# Patient Record
Sex: Male | Born: 2000
Health system: Southern US, Community
[De-identification: ages and names within clinical notes are randomized; demographics above are authoritative.]

## PROBLEM LIST (undated history)

## (undated) DIAGNOSIS — B356 Tinea cruris: Secondary | ICD-10-CM

## (undated) DIAGNOSIS — J069 Acute upper respiratory infection, unspecified: Secondary | ICD-10-CM

## (undated) DIAGNOSIS — S62102A Fracture of unspecified carpal bone, left wrist, initial encounter for closed fracture: Secondary | ICD-10-CM

## (undated) DIAGNOSIS — J302 Other seasonal allergic rhinitis: Secondary | ICD-10-CM

## (undated) HISTORY — DX: Tinea cruris: B35.6

## (undated) HISTORY — DX: Acute upper respiratory infection, unspecified: J06.9

## (undated) HISTORY — DX: Other seasonal allergic rhinitis: J30.2

## (undated) HISTORY — PX: NO PAST SURGERIES: SHX2092

## (undated) HISTORY — DX: Fracture of unspecified carpal bone, left wrist, initial encounter for closed fracture: S62.102A

---
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
## 2020-01-21 MED FILL — ADDERALL XR 30 MG CAP SA: 30 | 30 days supply | Qty: 30 | Fill #0

## 2020-02-20 MED FILL — ADDERALL XR 30 MG CAP SA: 30 | 30 days supply | Qty: 30 | Fill #0

## 2020-04-10 ENCOUNTER — Encounter: Payer: Self-pay | Admitting: Internal Medicine

## 2020-04-10 DIAGNOSIS — Z7184 Encounter for health counseling related to travel: Secondary | ICD-10-CM

## 2020-04-16 ENCOUNTER — Other Ambulatory Visit: Payer: Self-pay

## 2020-04-16 DIAGNOSIS — Z7184 Encounter for health counseling related to travel: Secondary | ICD-10-CM

## 2020-04-21 NOTE — Addendum Note (Signed)
Addended byConrad Hanover D on: 04/21/2020 03:55 PM   Modules accepted: Orders

## 2020-04-24 ENCOUNTER — Other Ambulatory Visit: Payer: Self-pay

## 2020-04-24 DIAGNOSIS — Z7184 Encounter for health counseling related to travel: Secondary | ICD-10-CM

## 2020-04-29 ENCOUNTER — Other Ambulatory Visit: Payer: Self-pay | Admitting: Internal Medicine

## 2020-05-01 LAB — SARS-COV-2 SEMI-QUANTITATIVE TOTAL ANTIBODY, SPIKE: SARS COV2 AB, Total Spike Semi QN: >2500 U/mL — ABNORMAL HIGH (ref <0.8)

## 2020-05-01 LAB — HOUSE ACCOUNT TRACKING

## 2020-05-13 ENCOUNTER — Encounter: Payer: Self-pay | Admitting: Family Medicine

## 2020-05-13 ENCOUNTER — Other Ambulatory Visit: Payer: Self-pay | Admitting: Family Medicine

## 2020-05-13 ENCOUNTER — Other Ambulatory Visit: Payer: Self-pay

## 2020-05-13 ENCOUNTER — Ambulatory Visit (INDEPENDENT_AMBULATORY_CARE_PROVIDER_SITE_OTHER): Payer: No Typology Code available for payment source | Admitting: Family Medicine

## 2020-05-13 VITALS — BP 126/78 | Temp 98.2°F | Ht 71.0 in | Wt 159.0 lb

## 2020-05-13 DIAGNOSIS — F988 Other specified behavioral and emotional disorders with onset usually occurring in childhood and adolescence: Secondary | ICD-10-CM | POA: Diagnosis not present

## 2020-05-13 MED ORDER — AMPHETAMINE-DEXTROAMPHET ER 30 MG PO CP24: 30.0000 mg | ORAL_CAPSULE | Freq: Every day | ORAL | 0 refills | Status: DC

## 2020-05-13 MED ORDER — AMPHETAMINE-DEXTROAMPHET ER 30 MG PO CP24: 30.0000 mg | ORAL_CAPSULE | ORAL | 0 refills | Status: DC

## 2020-05-13 NOTE — Progress Notes (Signed)
   Subjective:    Patient ID: Matthew Ibarra, male    DOB: [birth year], 19 y.o.   MRN: [id number]  HPIpt arrives today to establish care and to get refills on adderall xr 30mg  in the mornings.   Pt states no concerns or problems today.  Patient was diagnosed couple years ago with ADD.  He is always had this ever since his early years in elementary school but never had a bad enough to affect his grades but then in college its been very difficult so therefore he started on the medicine have tried a couple different medicines in a couple different dosage things but the current medication is doing the best for him it does allow him to focus does not keep him awake at night Patient does eat and drink on a regular basis stays physically active  Review of Systems  Constitutional: Negative for activity change, appetite change and fatigue.  HENT: Negative for congestion and rhinorrhea.   Respiratory: Negative for cough and shortness of breath.   Cardiovascular: Negative for chest pain and leg swelling.  Gastrointestinal: Negative for abdominal pain, nausea and vomiting.  Neurological: Negative for dizziness and headaches.  Psychiatric/Behavioral: Negative for agitation and behavioral problems.       Objective:   Physical Exam Constitutional:      General: He is not in acute distress.    Appearance: He is well-developed.  HENT:     Head: Normocephalic.  Cardiovascular:     Rate and Rhythm: Normal rate and regular rhythm.     Heart sounds: Normal heart sounds. No murmur heard.   Pulmonary:     Effort: Pulmonary effort is normal.     Breath sounds: Normal breath sounds.  Skin:    General: Skin is warm and dry.  Neurological:     Mental Status: He is alert.  Psychiatric:        Behavior: Behavior normal.           Assessment & Plan:  Adult ADD Doing well on medication 3 scripts were put in today Patient can message me when he needs his next prescription and we could do a phone  visit He is a at [facility] Certainly if any setbacks or problems to notify [facility] sooner.  Adult wellness on a regular basis recommended Patient states that his dad who is a physician did some blood work he will have that faxed to [place]

## 2020-05-23 ENCOUNTER — Encounter: Payer: Self-pay | Admitting: Family Medicine

## 2020-05-23 DIAGNOSIS — Z1321 Encounter for screening for nutritional disorder: Secondary | ICD-10-CM

## 2020-05-23 DIAGNOSIS — Z1329 Encounter for screening for other suspected endocrine disorder: Secondary | ICD-10-CM

## 2020-05-23 DIAGNOSIS — Z0000 Encounter for general adult medical examination without abnormal findings: Secondary | ICD-10-CM

## 2020-05-23 DIAGNOSIS — Z13220 Encounter for screening for lipoid disorders: Secondary | ICD-10-CM

## 2020-05-23 DIAGNOSIS — Z79899 Other long term (current) drug therapy: Secondary | ICD-10-CM

## 2020-05-26 NOTE — Addendum Note (Signed)
Addended by: Marlowe Shores on: 05/26/2020 04:25 PM   Modules accepted: Orders

## 2020-05-26 NOTE — Telephone Encounter (Signed)
Nurses Patient had lab work back in 2019 and at that time comprehensive lab work was completed I recommend CMP, CBC, lipid, vitamin D, TSH, free T4, T3

## 2020-05-28 ENCOUNTER — Encounter: Payer: Self-pay | Admitting: Family Medicine

## 2020-05-28 ENCOUNTER — Other Ambulatory Visit: Payer: Self-pay | Admitting: Family Medicine

## 2020-05-28 MED ORDER — AMPHETAMINE-DEXTROAMPHET ER 30 MG PO CP24: 30.0000 mg | ORAL_CAPSULE | ORAL | 0 refills | Status: DC

## 2020-05-28 MED ORDER — AMPHETAMINE-DEXTROAMPHET ER 30 MG PO CP24: 30.0000 mg | ORAL_CAPSULE | Freq: Every day | ORAL | 0 refills | Status: DC

## 2020-05-29 MED FILL — ADDERALL XR 30 MG CAP SA: 30 | 30 days supply | Qty: 30 | Fill #0

## 2020-06-17 ENCOUNTER — Encounter: Payer: No Typology Code available for payment source | Admitting: Internal Medicine

## 2020-06-26 MED FILL — ADDERALL XR 30 MG CAP SA: 30 | 30 days supply | Qty: 30 | Fill #0

## 2020-07-25 MED FILL — ADDERALL XR 30 MG CAP SA: 30 | 30 days supply | Qty: 30 | Fill #0

## 2020-08-27 ENCOUNTER — Other Ambulatory Visit: Payer: Self-pay | Admitting: Family Medicine

## 2020-08-28 ENCOUNTER — Other Ambulatory Visit: Payer: Self-pay | Admitting: Family Medicine

## 2020-08-29 ENCOUNTER — Other Ambulatory Visit: Payer: Self-pay | Admitting: Family Medicine

## 2020-08-29 ENCOUNTER — Other Ambulatory Visit: Payer: Self-pay | Admitting: *Deleted

## 2020-08-29 MED ORDER — AMPHETAMINE-DEXTROAMPHET ER 30 MG PO CP24: 30.0000 mg | ORAL_CAPSULE | ORAL | 0 refills | Status: DC

## 2020-08-29 MED FILL — ADDERALL XR 30 MG CAP SA: 30 | 15 days supply | Qty: 15 | Fill #0

## 2020-08-29 NOTE — Telephone Encounter (Signed)
Pt states he sent a mychart message yesterday but never heard back. Requesting a refill on adderall to [facility]. Last ADD check up 05/13/20.

## 2020-08-29 NOTE — Telephone Encounter (Signed)
Pt notified a 2 week supply was sent in and that he needed an appt and transferred up to schedule an appt

## 2020-08-29 NOTE — Addendum Note (Signed)
Addended by: Margaretha Sheffield on: 08/29/2020 04:48 PM   Modules accepted: Orders

## 2020-08-31 ENCOUNTER — Other Ambulatory Visit: Payer: Self-pay | Admitting: Family Medicine

## 2020-09-05 ENCOUNTER — Other Ambulatory Visit: Payer: Self-pay

## 2020-09-05 ENCOUNTER — Other Ambulatory Visit: Payer: Self-pay | Admitting: Family Medicine

## 2020-09-05 ENCOUNTER — Telehealth (INDEPENDENT_AMBULATORY_CARE_PROVIDER_SITE_OTHER): Payer: No Typology Code available for payment source | Admitting: Family Medicine

## 2020-09-05 DIAGNOSIS — F988 Other specified behavioral and emotional disorders with onset usually occurring in childhood and adolescence: Secondary | ICD-10-CM | POA: Diagnosis not present

## 2020-09-05 MED ORDER — AMPHETAMINE-DEXTROAMPHET ER 30 MG PO CP24
30.0000 mg | ORAL_CAPSULE | ORAL | 0 refills | Status: DC
Start: 2020-09-05 — End: 2021-01-16

## 2020-09-05 MED ORDER — AMPHETAMINE-DEXTROAMPHET ER 30 MG PO CP24: 30.0000 mg | ORAL_CAPSULE | Freq: Every day | ORAL | 0 refills | Status: DC

## 2020-09-05 MED ORDER — AMPHETAMINE-DEXTROAMPHET ER 30 MG PO CP24
30.0000 mg | ORAL_CAPSULE | Freq: Every day | ORAL | 0 refills | Status: DC
Start: 2020-09-05 — End: 2021-01-16

## 2020-09-05 NOTE — Progress Notes (Signed)
   Subjective:    Patient ID: Matthew Ibarra, male    DOB: Apr 15, 2001, 19 y.o.   MRN: [id number]  HPIADD check up. Taking adderall xr 30 mg one daily. Working well and no problems.  Young man is putting in a very strong effort at [employer].  He is studying multiple different courses that are very difficult.  Medication does help him stay intensive.  He denies any setbacks.  Able to sleep well.  Eating well.  Energy level good.  Moods are doing well denies being depressed.  His toughest classes physics easiest his physiology.  Very nice young man Virtual Visit via Telephone Note  I connected with Matthew Ibarra on 09/05/20 at  1:10 PM EDT by telephone and verified that I am speaking with the correct person using two identifiers.  Location: Patient: home Provider: office   I discussed the limitations, risks, security and privacy concerns of performing an evaluation and management service by telephone and the availability of in person appointments. I also discussed with the patient that there may be a patient responsible charge related to this service. The patient expressed understanding and agreed to proceed.   History of Present Illness:    Observations/Objective:   Assessment and Plan:   Follow Up Instructions:    I discussed the assessment and treatment plan with the patient. The patient was provided an opportunity to ask questions and all were answered. The patient agreed with the plan and demonstrated an understanding of the instructions.   The patient was advised to call back or seek an in-person evaluation if the symptoms worsen or if the condition fails to improve as anticipated.  I provided 25 including time on phone chart review sending in prescriptions and documentation minutes of non-face-to-face time during this encounter.        Review of Systems  Constitutional: Negative for activity change, appetite change and fatigue.  HENT: Negative for congestion and rhinorrhea.    Respiratory: Negative for cough and shortness of breath.   Cardiovascular: Negative for chest pain and leg swelling.  Gastrointestinal: Negative for abdominal pain, nausea and vomiting.  Neurological: Negative for dizziness and headaches.  Psychiatric/Behavioral: Negative for agitation and behavioral problems.       Objective:   Physical Exam  Today's visit was via telephone Physical exam was not possible for this visit       Assessment & Plan:  The patient was seen today as part of the visit regarding ADD.  Patient is stable on current regimen.  Appropriate prescriptions prescribed.  Medications were reviewed with the patient as well as compliance. Side effects were checked for. Discussion regarding effectiveness was held. Prescriptions were electronically sent in.  Patient reminded to follow-up in approximately 3 months.   Plans to [facility] law with drug registry was checked and verified while present with the patient.

## 2020-09-15 MED FILL — ADDERALL XR 30 MG CAP SA: 30 | 30 days supply | Qty: 30 | Fill #0

## 2020-10-16 MED FILL — ADDERALL XR 30 MG CAP SA: 30 | 30 days supply | Qty: 30 | Fill #0

## 2020-12-01 MED FILL — ADDERALL XR 30 MG CAP SA: 30 | 30 days supply | Qty: 30 | Fill #0

## 2020-12-17 ENCOUNTER — Encounter: Payer: Self-pay | Admitting: Family Medicine

## 2021-01-01 ENCOUNTER — Other Ambulatory Visit: Payer: Self-pay | Admitting: Family Medicine

## 2021-01-02 ENCOUNTER — Other Ambulatory Visit: Payer: Self-pay | Admitting: Family Medicine

## 2021-01-02 ENCOUNTER — Other Ambulatory Visit: Payer: Self-pay

## 2021-01-02 ENCOUNTER — Telehealth: Payer: Self-pay | Admitting: *Deleted

## 2021-01-02 ENCOUNTER — Telehealth (INDEPENDENT_AMBULATORY_CARE_PROVIDER_SITE_OTHER): Payer: No Typology Code available for payment source | Admitting: Family Medicine

## 2021-01-02 DIAGNOSIS — F988 Other specified behavioral and emotional disorders with onset usually occurring in childhood and adolescence: Secondary | ICD-10-CM | POA: Diagnosis not present

## 2021-01-02 MED ORDER — LISDEXAMFETAMINE DIMESYLATE 30 MG PO CAPS: 30.0000 mg | ORAL_CAPSULE | Freq: Every day | ORAL | 0 refills | Status: DC

## 2021-01-02 MED FILL — VYVANSE 30 MG CAPSULE: 30 | 30 days supply | Qty: 30 | Fill #0

## 2021-01-02 NOTE — Telephone Encounter (Signed)
Mr. razi, hickle are scheduled for a virtual visit with your provider today.    Just as we do with appointments in the office, we must obtain your consent to participate.  Your consent will be active for this visit and any virtual visit you may have with one of our providers in the next 365 days.    If you have a MyChart account, I can also send a copy of this consent to you electronically.  All virtual visits are billed to your insurance company just like a traditional visit in the office.  As this is a virtual visit, video technology does not allow for your provider to perform a traditional examination.  This may limit your provider's ability to fully assess your condition.  If your provider identifies any concerns that need to be evaluated in person or the need to arrange testing such as labs, EKG, etc, we will make arrangements to do so.    Although advances in technology are sophisticated, we cannot ensure that it will always work on either your end or our end.  If the connection with a video visit is poor, we may have to switch to a telephone visit.  With either a video or telephone visit, we are not always able to ensure that we have a secure connection.   I need to obtain your verbal consent now.   Are you willing to proceed with your visit today?   Matthew Ibarra has provided verbal consent on 01/02/2021 for a virtual visit (video or telephone).

## 2021-01-02 NOTE — Progress Notes (Signed)
   Subjective:    Patient ID: Matthew Ibarra, male    DOB: 2001/03/06, 20 y.o.   MRN: [id number]  HPI  Patient calls to discuss ADHD. Patient states he would like to discuss changing his ADHD medication due to side effects. Very nice patient Works very hard at school  Patient very busy at school.  Classes seem to be going well.  Does try to exercise on a regular basis.  Also tries to eat healthy for the most part.  In addition to this goes to bed around 10:00 at night gets up around 5 AM and gets approximately 6 hours of sleep per night moods are doing good Dietary going well He does state that the medication seems to make him feel on edge and at times makes his hands tremor he is interested in trying a different medicine.  His mother takes Vyvanse. Virtual Visit via Telephone Note  I connected with Matthew Ibarra on 01/02/21 at  8:40 AM EST by telephone and verified that I am speaking with the correct person using two identifiers.  Location: Patient: home Provider: office   I discussed the limitations, risks, security and privacy concerns of performing an evaluation and management service by telephone and the availability of in person appointments. I also discussed with the patient that there may be a patient responsible charge related to this service. The patient expressed understanding and agreed to proceed.   History of Present Illness:    Observations/Objective:   Assessment and Plan:   Follow Up Instructions:    I discussed the assessment and treatment plan with the patient. The patient was provided an opportunity to ask questions and all were answered. The patient agreed with the plan and demonstrated an understanding of the instructions.   The patient was advised to call back or seek an in-person evaluation if the symptoms worsen or if the condition fails to improve as anticipated.  I provided 20 chart preparation phone time documentation minutes of non-face-to-face  time during this encounter.       Review of Systems On edge at times with medication, some tremor    Objective:   Physical Exam   Today's visit was via telephone Physical exam was not possible for this visit      Assessment & Plan:  Adult ADD Has had this since early years of school.  Has been on Adderall.  This medication seemed to be doing well up until recently.  We will switch over to Vyvanse 30 mg Patient will send [place] a MyChart message in 2 weeks we can adjust upward or downward depending on how he is doing with this  We did discuss the concept that the frontal lobe matures as a person gets older and for men typically matures by age 24 so therefore later he may not necessarily need to be on as much medicine and in some cases not on any medicine.  Currently the medicine does benefit him but he would prefer to be on a dose that he does not have as much side effects and he would like to try Vyvanse which I am in agreement with.  He will do a in person visit after school is finished in the spring.  Goes to [employer].

## 2021-01-05 ENCOUNTER — Telehealth: Payer: Self-pay | Admitting: *Deleted

## 2021-01-05 NOTE — Telephone Encounter (Signed)
good

## 2021-01-05 NOTE — Telephone Encounter (Signed)
[facility] sent fax stating that Vyvanse 30 mg is not covered by insurance plan: I Clinically appropriate you may change the prescription. Please advise

## 2021-01-05 NOTE — Telephone Encounter (Signed)
[facility] pharmacy states to disregard the message -they were able to get the medication to a $30 charge and it has been picked up with no problems

## 2021-01-05 NOTE — Telephone Encounter (Signed)
Nurses-please try to find out what is covered thank you

## 2021-01-16 ENCOUNTER — Encounter: Payer: Self-pay | Admitting: Family Medicine

## 2021-01-16 ENCOUNTER — Other Ambulatory Visit: Payer: Self-pay | Admitting: Family Medicine

## 2021-01-19 ENCOUNTER — Other Ambulatory Visit: Payer: Self-pay | Admitting: Family Medicine

## 2021-01-19 MED ORDER — LISDEXAMFETAMINE DIMESYLATE 40 MG PO CAPS: ORAL_CAPSULE | ORAL | 0 refills | Status: DC

## 2021-01-19 NOTE — Telephone Encounter (Signed)
Nurses Please pend Vyvanse 40 mg, #30, 1 every morning  He will be able to try this dosage for the next month and if this is doing well we can send an additional prescriptions

## 2021-01-19 NOTE — Addendum Note (Signed)
Addended by: Marlowe Shores on: 01/19/2021 09:01 AM   Modules accepted: Orders

## 2021-02-10 ENCOUNTER — Encounter: Payer: Self-pay | Admitting: Family Medicine

## 2021-02-10 NOTE — Telephone Encounter (Signed)
Last physical 2018. Seen this month for med check up. Do you want pt to schedule wellness to fill out form?

## 2021-02-11 ENCOUNTER — Other Ambulatory Visit: Payer: Self-pay | Admitting: Family Medicine

## 2021-02-11 DIAGNOSIS — Z111 Encounter for screening for respiratory tuberculosis: Secondary | ICD-10-CM

## 2021-02-11 NOTE — Telephone Encounter (Signed)
Nurses If the form is straightforward and is essentially just needing a TB test he can do the TB test through [place].   It would be wise for the patient to know that the TB test is a two-step meaning doing a TB test and reading it a couple days later. All he can do gold TB test which is a blood test which does not require the follow-up If his forearm does have a physical portion within it that requires documentation of a physical exam I would recommend a physical otherwise I am willing to fill out the form Finally just for good health it would be wise for him to set up a wellness checkup somewhere in the summer Please work with Matthew Ibarra regarding all this thanks

## 2021-03-03 LAB — QUANTIFERON-TB GOLD PLUS
QuantiFERON Mitogen Value: >10 IU/mL
QuantiFERON Nil Value: 0.08 IU/mL
QuantiFERON TB1 Ag Value: 0.12 IU/mL
QuantiFERON TB2 Ag Value: 0.14 IU/mL
QuantiFERON-TB Gold Plus: NEGATIVE

## 2021-03-04 ENCOUNTER — Encounter: Payer: Self-pay | Admitting: Family Medicine

## 2021-03-04 ENCOUNTER — Other Ambulatory Visit (HOSPITAL_COMMUNITY): Payer: Self-pay

## 2021-03-04 ENCOUNTER — Other Ambulatory Visit: Payer: Self-pay | Admitting: Family Medicine

## 2021-03-04 ENCOUNTER — Telehealth: Payer: Self-pay | Admitting: Family Medicine

## 2021-03-04 MED ORDER — LISDEXAMFETAMINE DIMESYLATE 40 MG PO CAPS
40.0000 mg | ORAL_CAPSULE | ORAL | 0 refills | Status: DC
  Filled 2021-03-04 – 2021-04-10 (×2): qty 30, 30d supply, fill #0

## 2021-03-04 MED ORDER — LISDEXAMFETAMINE DIMESYLATE 40 MG PO CAPS
40.0000 mg | ORAL_CAPSULE | Freq: Every morning | ORAL | 0 refills | Status: DC
  Filled 2021-03-04: qty 30, 30d supply, fill #0

## 2021-03-04 NOTE — Telephone Encounter (Signed)
I will send in 1 additional refill on his ADD medicine

## 2021-03-05 ENCOUNTER — Other Ambulatory Visit (HOSPITAL_COMMUNITY): Payer: Self-pay

## 2021-03-08 NOTE — Telephone Encounter (Signed)
Nurses I saw this MyChart message late Friday after office hours.  The form does appear to be completed.  Please forward it to the patient.

## 2021-04-10 ENCOUNTER — Other Ambulatory Visit (HOSPITAL_COMMUNITY): Payer: Self-pay

## 2021-06-26 ENCOUNTER — Other Ambulatory Visit: Payer: Self-pay | Admitting: Family Medicine

## 2021-06-26 ENCOUNTER — Other Ambulatory Visit (HOSPITAL_COMMUNITY): Payer: Self-pay

## 2021-06-27 MED ORDER — LISDEXAMFETAMINE DIMESYLATE 40 MG PO CAPS
40.0000 mg | ORAL_CAPSULE | ORAL | 0 refills | Status: DC
  Filled 2021-06-27 – 2021-07-01 (×2): qty 30, 30d supply, fill #0

## 2021-06-29 ENCOUNTER — Other Ambulatory Visit (HOSPITAL_COMMUNITY): Payer: Self-pay

## 2021-07-01 ENCOUNTER — Other Ambulatory Visit (HOSPITAL_COMMUNITY): Payer: Self-pay

## 2021-07-02 ENCOUNTER — Other Ambulatory Visit (HOSPITAL_COMMUNITY): Payer: Self-pay

## 2021-07-09 ENCOUNTER — Other Ambulatory Visit (HOSPITAL_COMMUNITY): Payer: Self-pay

## 2021-08-17 ENCOUNTER — Encounter: Payer: Self-pay | Admitting: Family Medicine

## 2021-08-17 ENCOUNTER — Other Ambulatory Visit: Payer: Self-pay | Admitting: Family Medicine

## 2021-08-17 ENCOUNTER — Other Ambulatory Visit (HOSPITAL_COMMUNITY): Payer: Self-pay

## 2021-08-17 MED ORDER — LISDEXAMFETAMINE DIMESYLATE 40 MG PO CAPS
40.0000 mg | ORAL_CAPSULE | Freq: Every morning | ORAL | 0 refills | Status: DC
  Filled 2021-08-17 – 2021-09-23 (×2): qty 30, 30d supply, fill #0

## 2021-08-17 MED ORDER — LISDEXAMFETAMINE DIMESYLATE 40 MG PO CAPS
40.0000 mg | ORAL_CAPSULE | ORAL | 0 refills | Status: DC
  Filled 2021-08-17 (×2): qty 30, 30d supply, fill #0

## 2021-08-17 NOTE — Telephone Encounter (Signed)
Nurses I sent into refills on his medication I would like for him to do a follow-up visit for ADD this can be this fall either virtually or in person for either later in October or if that does not work for his schedule by November thanks-Dr.Rosamond Andress

## 2021-08-21 ENCOUNTER — Telehealth: Payer: Self-pay | Admitting: Family Medicine

## 2021-08-21 DIAGNOSIS — Z8489 Family history of other specified conditions: Secondary | ICD-10-CM

## 2021-08-21 NOTE — Telephone Encounter (Signed)
Nurses Cardiology consultation Reason family history of sudden death Family history LVH and cardiomyopathy (His father was [name] Freitas who suffered sudden death and had died due to dilated cardiomyopathy and sudden death with left ventricular hypertrophy)  Family prefers [facility] cardiology because patient goes to school at [employer]

## 2021-08-21 NOTE — Telephone Encounter (Signed)
Referral placed in Epic.

## 2021-08-27 ENCOUNTER — Telehealth: Payer: Self-pay | Admitting: Family Medicine

## 2021-08-27 NOTE — Telephone Encounter (Signed)
Nurses Patient's mother reached out to me (Dr. Karilyn Cota) stating that cardiology did not receive the information regarding the referral Please send Toni Amend a message Family connected with cardiology Cardiology stated that they did not receive a referral Please have Toni Amend resend information to cardiology regarding this referral Thanks-Dr. Lorin Picket

## 2021-09-02 NOTE — Telephone Encounter (Signed)
Noted thank you, I did send a staff message to his mother who is a pediatrician

## 2021-09-23 ENCOUNTER — Telehealth: Payer: Self-pay | Admitting: Family Medicine

## 2021-09-23 ENCOUNTER — Other Ambulatory Visit (HOSPITAL_COMMUNITY): Payer: Self-pay

## 2021-09-23 NOTE — Telephone Encounter (Signed)
Patient schedule follow up office visit with Dr Lorin Picket 10/21/21. Patient states he does not need a refill of his medication at this time- he will have enough till his appointment.

## 2021-09-23 NOTE — Telephone Encounter (Signed)
It would be fine to give him an appointment anywhere in the next 3 to 4 weeks I am willing to work with his schedule if it is better to do a virtual visit toward the end of the afternoon we could do this around 4:40 PM Let me know if something else needs to be done  If he is out of medication I can easily send him in a refill.  Does he need a refill currently?

## 2021-09-23 NOTE — Telephone Encounter (Signed)
Patient is in [place]. States he usually does phone or virtual visit for ADHD meds. Needs appointment to get meds. Please advise.  Pt#: [phone]

## 2021-10-21 ENCOUNTER — Other Ambulatory Visit (HOSPITAL_COMMUNITY): Payer: Self-pay

## 2021-10-21 ENCOUNTER — Other Ambulatory Visit: Payer: Self-pay

## 2021-10-21 ENCOUNTER — Ambulatory Visit (INDEPENDENT_AMBULATORY_CARE_PROVIDER_SITE_OTHER): Payer: No Typology Code available for payment source | Admitting: Family Medicine

## 2021-10-21 DIAGNOSIS — F988 Other specified behavioral and emotional disorders with onset usually occurring in childhood and adolescence: Secondary | ICD-10-CM | POA: Diagnosis not present

## 2021-10-21 MED ORDER — LISDEXAMFETAMINE DIMESYLATE 40 MG PO CAPS
40.0000 mg | ORAL_CAPSULE | ORAL | 0 refills | Status: DC
  Filled 2021-10-21 – 2022-01-04 (×2): qty 30, 30d supply, fill #0

## 2021-10-21 MED ORDER — LISDEXAMFETAMINE DIMESYLATE 40 MG PO CAPS
40.0000 mg | ORAL_CAPSULE | Freq: Every morning | ORAL | 0 refills | Status: DC
  Filled 2021-10-21 – 2022-02-18 (×2): qty 30, 30d supply, fill #0

## 2021-10-21 MED ORDER — LISDEXAMFETAMINE DIMESYLATE 40 MG PO CAPS
40.0000 mg | ORAL_CAPSULE | ORAL | 0 refills | Status: DC
  Filled 2021-10-21 – 2021-10-22 (×3): qty 30, 30d supply, fill #0

## 2021-10-21 NOTE — Progress Notes (Signed)
  I connected with  PEYSON POSTEMA on 10/21/21 by a phone enabled telemedicine application and verified that I am speaking with the correct person using two identifiers.   I discussed the limitations of evaluation and management by telemedicine. The patient expressed understanding and agreed to proceed.  Patient location: home  Provider location: in office  I provided 15 minutes of non face - to - face time during this encounter.  Subjective:    Patient ID: Matthew Ibarra, male    DOB: 07/26/2001, 20 y.o.   MRN: [id number]  HPI  Patient was seen today for ADD checkup.  This patient does have ADD.  Patient takes medications for this.  If this does help control overall symptoms.  Please see below. -weight, vital signs reviewed.  The following items were covered. -Compliance with medication : daily  -Problems with completing homework, paying attention/taking good notes in school: none  -grades: ok  - Eating patterns : no  -sleeping: no  -Additional issues or questions: none He is going through stress related to the loss of his dad earlier this year.  He is doing therapy which is helping  His school classes are going well and biology chemistry as well as exercise physiology.  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  Patient is stable on current regimen.  Appropriate prescriptions prescribed.  Medications were reviewed with the patient as well as compliance. Side effects were checked for. Discussion regarding effectiveness was held. Prescriptions were electronically  sent in.  Patient reminded to follow-up in approximately 3 months.   Plans to [facility] law with drug registry was checked and verified while present with the patient.  Stress doing well with counseling  I did recommend cholesterol profile for him and he is to consider this-we will discuss it with his mother and get back with [place].  Follow-up in 4 months follow-up sooner if any problems

## 2021-10-22 ENCOUNTER — Other Ambulatory Visit (HOSPITAL_COMMUNITY): Payer: Self-pay

## 2021-12-09 ENCOUNTER — Telehealth: Payer: Self-pay | Admitting: *Deleted

## 2021-12-09 ENCOUNTER — Telehealth: Payer: Self-pay | Admitting: Family Medicine

## 2021-12-09 DIAGNOSIS — Z8489 Family history of other specified conditions: Secondary | ICD-10-CM

## 2021-12-09 NOTE — Telephone Encounter (Signed)
Nurses Per message from Dr. Anastasio Champion And pertaining the previous message regarding the referral to geneticist Please note the following "They both already have an appt. It is with Melchor Amour "[facility]" on Feb 23rd " Please make sure that Loma Sousa is aware thank you

## 2021-12-09 NOTE — Telephone Encounter (Signed)
Nurses-May have referral to geneticist with [facility]-it would be important for Toni Amend to work with son Tebo.  Apparently they will approve out of network visit with geneticist if it is explained this is because the cardiologist at [facility] recommended this specific cardiology geneticist.  If it requires a letter on my part then Toni Amend will need to let me know as well.    Also the following was a message that Dr. Karilyn Cota sent-I called [employer] as I received a bill for [name]'s echo from [facility] and it was $1800.  I questioned them and the person stated since it was out of network, I had to pay "the full amount".  I told her that I had spoken to someone else prior to the appointment, I was aware that this was out of network, however I was told that [employer] would pay 60% and I would pay for 40%.  At which point she said "my bad" and looked at the policy again and realized that we do have coverage for out of network physicians.  Therefore, all of Sergey's and [name]'s have to be reprocessed!  We are trying to find out if that means we have to do anything-I will let you know on this behalf

## 2021-12-09 NOTE — Telephone Encounter (Signed)
Patient needs referral for [employer] insurance for patient to see geneticist at [facility] as recommended by [facility] cardiology. °

## 2021-12-10 ENCOUNTER — Telehealth: Payer: Self-pay | Admitting: Family Medicine

## 2021-12-10 NOTE — Telephone Encounter (Signed)
Referral placed. Please advise. Thank you

## 2021-12-10 NOTE — Telephone Encounter (Signed)
FYI

## 2021-12-10 NOTE — Telephone Encounter (Signed)
In regards to the previous patient's referral to a geneticist at [facility] Patient has a Energy manager through [employer] often it requires a specific referral to get covered Please find out from [place] we may have to do a letter in order to get this covered?  If so I can do a letter

## 2021-12-11 ENCOUNTER — Telehealth: Payer: Self-pay | Admitting: Family Medicine

## 2021-12-11 NOTE — Telephone Encounter (Signed)
Per Toni Amend: Hey - So I am confused on the Gostrani's ... The [employer] Plan does not require Patient's to place Referrals on file anymore to be paid - I can send the Referral but If the Cardiology recommended the Referral and Dr. Karilyn Cota is aware that out of network will cover 60% then that's really all we can do If it is out of network then they may need a letter of Medical Necessity but if Dr. Gerda Diss did not recommend then I am not sure that the letter needs to come from him - I would think the letter would come from the Cardiology Office at [facility]. & also the [facility] is going to need the records from [facility] so I am very confused why Dr. Gerda Diss has to do all the work when we are not referring the Cardiology Office is  Please advise. Thank you

## 2021-12-11 NOTE — Telephone Encounter (Signed)
Per referral specialist:   The Ordering Physcian will need to write a letter of medical Necessity stated there are no Genetic Office's In-Network . It will only approve out of network coverage if there is no type of that specialist available in network  °

## 2021-12-13 ENCOUNTER — Encounter: Payer: Self-pay | Admitting: Family Medicine

## 2021-12-13 NOTE — Telephone Encounter (Signed)
A letter was dictated I would recommend that you communicate with his mother- Dr.[name] Shifflett that we did do a letter to support the referral.  But also she may end up having to have the cardiologist at [facility] do a letter as well on why the patient needs to see the geneticist at [facility] and not the geneticist with [facility] health

## 2021-12-13 NOTE — Telephone Encounter (Signed)
Please update the mom regarding the message that Pierce gave.  I would recommend updating her in regards to [employer] covering this but not necessarily the part of Dr. Gerda Diss doing all the work(in other words update her and be diplomatic thank you)

## 2021-12-14 NOTE — Telephone Encounter (Signed)
Letter printed and up front for pick up- notified letter ready for pick up

## 2021-12-14 NOTE — Telephone Encounter (Signed)
Letter printed and up front for pick up- notified letter ready for pick up °

## 2022-01-04 ENCOUNTER — Other Ambulatory Visit (HOSPITAL_COMMUNITY): Payer: Self-pay

## 2022-01-05 ENCOUNTER — Other Ambulatory Visit (HOSPITAL_COMMUNITY): Payer: Self-pay

## 2022-01-19 ENCOUNTER — Other Ambulatory Visit: Payer: Self-pay | Admitting: *Deleted

## 2022-01-19 DIAGNOSIS — Z8489 Family history of other specified conditions: Secondary | ICD-10-CM

## 2022-02-18 ENCOUNTER — Other Ambulatory Visit (HOSPITAL_COMMUNITY): Payer: Self-pay

## 2022-03-02 NOTE — Progress Notes (Deleted)
MEDICAL GENETICS NEW PATIENT EVALUATION  Patient name: Matthew Ibarra DOB: November 26, 2000 Age: 21 y.o. MRN: [id number]  Referring Provider/Specialty: *** / *** Date of Evaluation: 03/02/2022*** Chief Complaint/Reason for Referral: ***  HPI: Matthew Ibarra is a 21 y.o. male who presents today for an initial genetics evaluation for ***. He is accompanied by his *** at today's visit.  ***  Matthew Ibarra's father died suddenly 2022-11-15at 15 yo- he was reported to have a shockable rhythm during CPR. Autopsy reported 1.7cm wall thickness, dilated LV, scarring and minimal atherosclerosis. In light of concern for possible hypertrophic cardiomyopathy, Matthew Ibarra was seen by Dr. Luretha Ibarra at [facility]. Dr. Luretha Ibarra noted the autopsy findings were not completely consistent with HCM (other possible causes could include sarcoid, endocardial fibroelastosis, or Fabry's). ECHO was performed on Matthew Ibarra- right ventricle is upper normal in size with normal systolic function, otherwise normal. Follow up recommended ***. Dr. Luretha Ibarra also recommended genetics evaluation given the family history (two paternal uncles also died suddenly in their 29s). Matthew Ibarra exercises 5 days a week and does not experience any cardiac symptoms. He has a history of ADHD and is on vyvanse.   Two paternal uncles that died suddenly in their 63s.  Prior genetic testing has not*** been performed.  Pregnancy/Birth History: Matthew Ibarra was born to a then *** year old G***P*** -> *** mother. The pregnancy was conceived ***naturally and was uncomplicated/complicated by ***. There were ***no exposures and labs were ***normal. Ultrasounds were normal/abnormal***. Amniotic fluid levels were ***normal. Fetal activity was ***normal. Genetic testing performed during the pregnancy included***/No genetic testing was performed during the pregnancy***.  Matthew Ibarra was born at Gestational Age: <None> gestation at [facility] via *** delivery. Apgar  scores were ***/***. There were ***no complications. Birth weight No birth weight on file. (***%), birth length *** in/*** cm (***%), head circumference *** cm (***%). He did ***not require a NICU stay. He was discharged home *** days after birth. He ***passed the newborn screen, hearing test and congenital heart screen.  Past Medical History: Past Medical History:  Diagnosis Date   Asthma 08/17/2011   hx of, mainly exercise induced   Seasonal allergies 08/17/2011   Wrist fracture, left    Patient Active Problem List   Diagnosis Date Noted   ADD (attention deficit disorder) 10/18/2019   Allergic rhinitis 11/13/2012   Headache(784.0) 11/13/2012   Asthma 08/17/2011   Seasonal allergies 08/17/2011    Past Surgical History:  Past Surgical History:  Procedure Laterality Date   NO PAST SURGERIES      Developmental History: Milestones -- ***  Therapies -- ***  Toilet training -- ***  School -- ***  Social History: Social History   Social History Narrative   Currently at home w/ parents, plans to move back to [place] ~ 11-2019    Medications: Current Outpatient Medications on File Prior to Visit  Medication Sig Dispense Refill   lisdexamfetamine (VYVANSE) 40 MG capsule Take 1 capsule (40 mg total) by mouth every morning.DNFB 12/18/21 30 capsule 0   lisdexamfetamine (VYVANSE) 40 MG capsule Take 1 capsule (40 mg total) by mouth every morning. 30 capsule 0   lisdexamfetamine (VYVANSE) 40 MG capsule Take 1 capsule (40 mg total) by mouth every morning. 30 capsule 0   loratadine (CLARITIN) 10 MG tablet Take 10 mg by mouth daily.     Melatonin 1 MG TABS Take by mouth at bedtime. Takes 2 - 3 tablets  VITAMIN D PO Take by mouth.     No current facility-administered medications on file prior to visit.    Allergies:  No Known Allergies  Immunizations: ***up to date  Review of Systems: General: *** Eyes/vision: *** Ears/hearing: *** Dental: *** Respiratory:  *** Cardiovascular: *** Gastrointestinal: *** Genitourinary: *** Endocrine: *** Hematologic: *** Immunologic: *** Neurological: *** Psychiatric: *** Musculoskeletal: *** Skin, Hair, Nails: ***  Family History: See pedigree below obtained during today's visit: ***  Notable family history: ***  Mother's ethnicity: *** Father's ethnicity: *** Consanguinity: ***Denies  Physical Examination: Weight: *** (***%) Height: *** (***%); mid-parental ***% Head circumference: *** (***%)  There were no vitals taken for this visit.  General: ***Alert, interactive Head: ***Normocephalic Eyes: ***Normoset, ***Normal lids, lashes, brows, ICD *** cm, OCD *** cm, Calculated***/Measured*** IPD *** cm (***%) Nose: *** Lips/Mouth/Teeth: *** Ears: ***Normoset and normally formed, no pits, tags or creases Neck: ***Normal appearance Chest: ***No pectus deformities, nipples appear normally spaced and formed, IND *** cm, CC *** cm, IND/CC ratio *** (***%) Heart: ***Warm and well perfused Lungs: ***No increased work of breathing Abdomen: ***Soft, non-distended, no masses, no hepatosplenomegaly, no hernias Genitalia: *** Skin: ***No axillary or inguinal freckling Hair: ***Normal anterior and posterior hairline, ***normal texture Neurologic: ***Normal gross motor by observation, no abnormal movements Psych: *** Back/spine: ***No scoliosis, ***no sacral dimple Extremities: ***Symmetric and proportionate Hands/Feet: ***Normal hands, fingers and nails, ***2 palmar creases bilaterally, ***Normal feet, toes and nails, ***No clinodactyly, syndactyly or polydactyly  ***Photos of patient in media tab (parental verbal consent obtained)  Prior Genetic testing: ***  Pertinent Labs: ***  Pertinent Imaging/Studies: ***  Assessment: COBEY RAINERI is a 21 y.o. male with ***. Growth parameters show ***. Development ***. Physical examination notable for ***. Family history is  ***.  Recommendations: ***  A ***blood/saliva/buccal sample was obtained during today's visit for the above genetic testing and sent to ***. Results are anticipated in ***4-6 weeks. We will contact the family to discuss results once available and arrange follow-up as needed.    Charline Bills, MS, [facility] Certified Genetic Counselor  Loletha Grayer, D.O. Attending Physician, Medical [facility] Date: 03/02/2022 Time: ***   Total time spent: *** Time spent includes face to face and non-face to face care for the patient on the date of this encounter (history and physical, genetic counseling, coordination of care, data gathering and/or documentation as outlined)

## 2022-03-12 ENCOUNTER — Ambulatory Visit (INDEPENDENT_AMBULATORY_CARE_PROVIDER_SITE_OTHER): Payer: Self-pay | Admitting: Pediatric Genetics

## 2022-03-23 ENCOUNTER — Encounter: Payer: Self-pay | Admitting: Family Medicine

## 2022-03-23 ENCOUNTER — Telehealth: Payer: Self-pay

## 2022-03-23 ENCOUNTER — Other Ambulatory Visit: Payer: Self-pay | Admitting: Family Medicine

## 2022-03-23 ENCOUNTER — Other Ambulatory Visit (HOSPITAL_COMMUNITY): Payer: Self-pay

## 2022-03-23 ENCOUNTER — Other Ambulatory Visit: Payer: Self-pay | Admitting: *Deleted

## 2022-03-23 DIAGNOSIS — Z111 Encounter for screening for respiratory tuberculosis: Secondary | ICD-10-CM

## 2022-03-23 MED ORDER — LISDEXAMFETAMINE DIMESYLATE 40 MG PO CAPS
40.0000 mg | ORAL_CAPSULE | Freq: Every morning | ORAL | 0 refills | Status: DC
  Filled 2022-03-23: qty 30, 30d supply, fill #0

## 2022-03-23 NOTE — Telephone Encounter (Signed)
May order quantaferin gold test also I did dictate a letter should be available via MyChart but if he needs a copy of this printed please do so and mail it to him the letter indicates that he is followed here and is on prescription Vyvanse but I did not go into details regarding his diagnosis-if he needs additional documentation let us know thank you ?

## 2022-03-23 NOTE — Telephone Encounter (Signed)
Patient requests an order for quantiferon test to test for TB for an upcoming class. States will be in to get it done approx by tomorrow, also having a drug test done and needs documentation saying he takes vyvanse.  ?

## 2022-03-23 NOTE — Telephone Encounter (Signed)
Patient informed that lab will be ordered and he did get the letter through MyChart. ?

## 2022-03-29 LAB — QUANTIFERON-TB GOLD PLUS
QuantiFERON Mitogen Value: >10 IU/mL
QuantiFERON Nil Value: 0.05 IU/mL
QuantiFERON TB1 Ag Value: 0.07 IU/mL
QuantiFERON TB2 Ag Value: 0.07 IU/mL
QuantiFERON-TB Gold Plus: NEGATIVE

## 2022-04-16 ENCOUNTER — Ambulatory Visit (INDEPENDENT_AMBULATORY_CARE_PROVIDER_SITE_OTHER): Payer: Self-pay | Admitting: Pediatric Genetics

## 2022-04-28 ENCOUNTER — Other Ambulatory Visit (HOSPITAL_COMMUNITY): Payer: Self-pay

## 2022-04-28 ENCOUNTER — Other Ambulatory Visit: Payer: Self-pay | Admitting: Family Medicine

## 2022-04-28 MED ORDER — LISDEXAMFETAMINE DIMESYLATE 40 MG PO CAPS
40.0000 mg | ORAL_CAPSULE | Freq: Every morning | ORAL | 0 refills | Status: DC
Start: 2022-04-28 — End: 2022-06-02
  Filled 2022-04-28: qty 30, 30d supply, fill #0

## 2022-05-06 ENCOUNTER — Ambulatory Visit (INDEPENDENT_AMBULATORY_CARE_PROVIDER_SITE_OTHER): Payer: No Typology Code available for payment source | Admitting: Pediatric Genetics

## 2022-05-19 NOTE — Progress Notes (Unsigned)
MEDICAL GENETICS NEW PATIENT EVALUATION  Patient name: Matthew Ibarra DOB: 04-14-2001 Age: 21 y.o. MRN: [id number]  Referring Provider/Specialty: [name], MD / Pediatrics Date of Evaluation: 05/27/2022 Chief Complaint/Reason for Referral: Family history of sudden cardiac death  HPI: Matthew Ibarra is a 21 y.o. male who presents today for an initial genetics evaluation for family history of sudden cardiac death. He is unaccompanied at today's visit.  Matthew Ibarra's father died suddenly and unexpectedly in October 2022 at 21 yo. He was reported to have a shockable rhythm during CPR. Autopsy reported 1.7cm wall thickness, dilated LV, scarring and minimal atherosclerosis. In light of concern for possible hypertrophic cardiomyopathy, Matthew Ibarra was seen by Dr. Jenne Campus at [facility]. Dr. Jenne Campus noted the autopsy findings were not completely consistent with HCM (other possible causes could include sarcoid, endocardial fibroelastosis, or 46). EKG and ECHO was performed on Matthew Ibarra. EKG showed normal sinus rhythm with RBBB and 1st degree AV block. His QTc is within normal limits. ECHO showed his right ventricle is upper normal in size with normal systolic function, otherwise normal. Follow up recommended every 5 years. Dr. Jenne Campus also recommended genetics evaluation given the family history (two paternal uncles also died suddenly in their 33s). Matthew Ibarra exercises regularly and does not experience any cardiac symptoms. He has a history of ADHD and is on vyvanse.   Prior genetic testing has not been performed.  Past Medical History: Past Medical History:  Diagnosis Date   Asthma 08/17/2011   hx of, mainly exercise induced   Seasonal allergies 08/17/2011   Wrist fracture, left    Patient Active Problem List   Diagnosis Date Noted   ADD (attention deficit disorder) 10/18/2019   Allergic rhinitis 11/13/2012   Headache(784.0) 11/13/2012   Asthma 08/17/2011   Seasonal allergies 08/17/2011    Past  Surgical History:  Past Surgical History:  Procedure Laterality Date   NO PAST SURGERIES      Developmental History: No developmental/learning concerns. School -- senior year at [employer]. Studying for MCAT- taking in August. Interested in Sports Medicine.  Social History: Social History   Social History Narrative   Currently at home w/ mom, plans to move back to [place]     Medications: Current Outpatient Medications on File Prior to Visit  Medication Sig Dispense Refill   lisdexamfetamine (VYVANSE) 40 MG capsule Take 1 capsule (40 mg total) by mouth every morning. 30 capsule 0   Melatonin 1 MG TABS Take by mouth at bedtime. Takes 2 - 3 tablets     VITAMIN D PO Take by mouth.     lisdexamfetamine (VYVANSE) 40 MG capsule Take 1 capsule (40 mg total) by mouth every morning. (Patient not taking: Reported on 05/27/2022) 30 capsule 0   lisdexamfetamine (VYVANSE) 40 MG capsule Take 1 capsule (40 mg total) by mouth every morning. (Patient not taking: Reported on 05/27/2022) 30 capsule 0   loratadine (CLARITIN) 10 MG tablet Take 10 mg by mouth daily. (Patient not taking: Reported on 05/27/2022)     No current facility-administered medications on file prior to visit.    Allergies:  No Known Allergies  Immunizations: up to date  Review of Systems: General: healthy, active. Eyes/vision: no concerns. Ears/hearing: no concerns. Dental: no concerns. Respiratory: no concerns. Cardiovascular: EKG (09/2021)- right bundle branch block and 1st degree AV Block. ECHO normal (10/2021)- right ventricle upper normal in size. Follow up with cardiology in 5 years. Gastrointestinal: no concerns. Genitourinary: no concerns. Endocrine: no concerns. Hematologic: no  concerns. Immunologic: no concerns. Neurological: no concerns. Psychiatric: recent death of father 09/25/21). Mother recently diagnosed with stage 0 breast cancer- undergoing radiation. Musculoskeletal: no concerns. Skin, Hair,  Nails: no concerns.  Family History: See pedigree below obtained during today's visit:    Notable family history: Matthew Ibarra is one of two children between his parents. He has a 89 yo sister who has Down syndrome and had a normal echocardiogram recently. His mother is 51 yo, a physician and was recently diagnosed with stage 0 breast cancer- she starts radiation today. The maternal grandfather has a type of gastrointestinal cancer, as well as a history of another type of cancer. Sylvan reports that his mother had genetic testing that may have identified a variant.   Americo's father died suddenly at the age of 23 (see above). Prior to his death, he was very active and healthy and was a physician. He did have hypertension (unlikely to be associated with his cardiac findings on autopsy per Rhian's cardiologist Dr. Jenne Campus). There may have been 2 paternal great uncles who passed away in their 29's suddenly.  Mother's ethnicity: [place] Father's ethnicity: [place] Consanguinity: Denies  Physical Examination: Weight: 74.8 kg (50-75%) Height: 5'10.55" (75%) Head circumference: 58.4 cm (98%) -- hair  Ht 5' 10.55" (1.792 m)   Wt 165 lb (74.8 kg)   HC 58.4 cm (22.99")   BMI 23.31 kg/m   General: Alert, interactive Head: Normocephalic Eyes: Normoset, Normal lids, lashes, brows Nose: Normal appearance Lips/Mouth/Teeth: Normal appearance Ears: Normoset and normally formed, no pits, tags or creases Neck: Normal appearance Heart: Warm and well perfused Lungs: No increased work of breathing Neurologic: Normal gross motor by observation, no abnormal movements Psych: Age-appropriate interactions, appropriate mood and affect Extremities: Symmetric and proportionate Hands/Feet: Normal hands, fingers and nails, 2 palmar creases bilaterally, Normal feet, toes and nails, No arachnodactyly, clinodactyly, syndactyly or polydactyly  Photo of patient updated in Epic   Prior Genetic testing: None  Pertinent  Labs: None  Pertinent Imaging/Studies: ECG 12 Lead 09/2021: Result Value  EKG Systolic BP  EKG Diastolic BP  EKG Ventricular Rate 90  EKG Atrial Rate 90  EKG P-R Interval 222  EKG QRS Duration 122  EKG Q-T Interval 358  EKG QTC Calculation 437  EKG Calculated P Axis 71  EKG Calculated R Axis 169  EKG Calculated T Axis 67  QTC Fredericia 409  Narrative  SINUS RHYTHM WITH 1ST DEGREE AV BLOCK RIGHT BUNDLE BRANCH BLOCK ABNORMAL ECG NO PREVIOUS ECGS AVAILABLE Confirmed by Matthew Ibarra ([id number]) on 09/27/2021 9:40:20 AM    ECHO 10/2021: Summary 1. The left ventricular systolic function is normal, LVEF is visually estimated at 55-60%. 2. The right ventricle is upper normal in size, with normal systolic function.  Left Ventricle The left ventricle is normal in size with normal wall thickness. The left ventricular systolic function is normal, LVEF is visually estimated at 55-60%. 3D echocardiography imaging was performed to better assess left ventricular contractile function. The concurrent supervision requirements have been met for 3D imaging. The left ventricular ejection fraction was quantified (3D) at 54 %. There is normal left ventricular diastolic function.  Right Ventricle The right ventricle is upper normal in size, with normal systolic function.   Left Atrium The left atrium is normal in size.  Right Atrium The right atrium is normal in size.   Aortic Valve The aortic valve is trileaflet with normal appearing leaflets with normal excursion. There is no significant aortic regurgitation. There is no evidence  of a significant transvalvular gradient.  Pulmonic Valve The pulmonic valve is normal. There is trivial pulmonic regurgitation. There is no evidence of a significant transvalvular gradient.  Mitral Valve The mitral valve leaflets are normal with normal leaflet mobility. There is no significant mitral valve regurgitation.  Tricuspid Valve The  tricuspid valve leaflets are normal, with normal leaflet mobility. There is no significant tricuspid regurgitation. The pulmonary systolic pressure cannot be estimated due to insufficient TR signal.   Pericardium/Pleural There is no pericardial effusion.  Inferior Vena Cava IVC size and inspiratory change suggest normal right atrial pressure. (0-5 mmHg).  Aorta The aorta is normal in size in the visualized segments.   Pulmonic Valve ---------------------------------------------------------------------- Name Value Normal ----------------------------------------------------------------------  PV Doppler ---------------------------------------------------------------------- PV Peak Velocity 141.0 cm/s  Mitral Valve ---------------------------------------------------------------------- Name Value Normal ----------------------------------------------------------------------  MV Diastolic Function ---------------------------------------------------------------------- MV E Peak Velocity 80 cm/s 60-129  MV A Peak Velocity 38 cm/s 21-68  MV E/A 2.1 1.2-3.6   MV Annular TDI ---------------------------------------------------------------------- MV Septal e' Velocity 10.9 cm/s 9.5-18.9  MV Lateral e' Velocity 17.6 cm/s 13.6-25.9  MV e' Average 14.3  MV E/e' (Average) 5.9 2.9-8.6  Tricuspid Valve ---------------------------------------------------------------------- Name Value Normal ----------------------------------------------------------------------  Estimated PAP/RSVP ---------------------------------------------------------------------- RA Pressure 3 mmHg  Aorta ---------------------------------------------------------------------- Name Value Normal ----------------------------------------------------------------------  Ascending Aorta ---------------------------------------------------------------------- Ao Root Diameter (2D) 3.2 cm 2.3-3.5  Ao Root Diam Index  (2D) 16.6 cm/m2  Asc Ao Diameter 2.7 cm  Venous ---------------------------------------------------------------------- Name Value Normal ----------------------------------------------------------------------  IVC/SVC ---------------------------------------------------------------------- IVC Diameter (Exp 2D) 2.2 cm  Aortic Valve ---------------------------------------------------------------------- Name Value Normal ----------------------------------------------------------------------  AV Doppler ---------------------------------------------------------------------- AV Peak Velocity 1.3 m/s  Ventricles ---------------------------------------------------------------------- Name Value Normal ----------------------------------------------------------------------  LV Dimensions 2D/MM ---------------------------------------------------------------------- IVS Diastolic Thickness (2D) 1.0 cm 0.6-1.1  LVID Diastole (2D) 5.3 cm 7.3-5.3  LVPW Diastolic Thickness (2D) 1.0 cm 0.7-1.0  LVID Systole (2D) 3.2 cm 2.8-3.9   RV Dimensions 2D/MM ---------------------------------------------------------------------- RV Basal Diastolic Dimension 4.0 cm  TAPSE 2.7 cm  Atria ---------------------------------------------------------------------- Name Value Normal ----------------------------------------------------------------------  LA Dimensions ---------------------------------------------------------------------- LA Dimension (2D) 3.3 cm  LA Volume (BP MOD) 48 ml  LA Volume Index (BP MOD) 24.70 ml/m2   RA Dimensions ---------------------------------------------------------------------- RA Area (4C) 15.0 cm2 11.7-22.0  RA Area (4C) Index 7.8 cm2/m2   QLAB ---------------------------------------------------------------------- Name Value Normal ----------------------------------------------------------------------  Heart  Model ---------------------------------------------------------------------- EDV HM 200.00 ml  ESV HM 91.00 ml  LV Length ED HM 9.60 cm  LV Length ES HM 8.20 cm  LV EF HM 54 %  LV SV HM 109.00 ml  LV CI HM 4.05 L/min/m2  Assessment: Matthew Ibarra is a 21 y.o. male whose family history is significant for sudden cardiac death in his father. Angelina himself is in good health. He has ADHD but is developmentally and intellectually appropriate otherwise and in good health. His own Cardiology evaluation last winter showed normal sinus rhythm with RBBB and 1st degree AV block and normal QTc on EKG. ECHO showed his right ventricle is upper normal in size with normal systolic function, otherwise normal. He has no dysmorphic features.  There are many different genes associated with various heart conditions. These genes may cause arrhythmia or cardiomyopathy that could lead to sudden cardiac death. Given the father's recent death and his family history of sudden cardiac death, it is reasonable to test Matthew Ibarra for any genes potentially associated with these conditions to understand his potential risk. Specifically, we recommend the [facility] Comprehensive Arrhythmia and Cardiomyopathy panel. This test is offered through a sponsored program  if an individual meets certain criteria. We will request that Matthew Ibarra be considered for sponsored testing, and if not, testing will be done through his insurance.  Matthew Ibarra was informed of the limitations of testing in an unaffected individual. Testing is most helpful if performed in an affected individual, as we are more likely to find a specific cause if present. When affected individuals are not available, as in Matthew Ibarra's case, testing of immediate relatives is recommended as it may impact management (who should follow with cardiology and how often).  If Erez's test is positive and a pathogenic variant is identified that may explain the family history, then specific recommendations  for management can be made at that time.  If his test is negative, then it is unknown if there was a genetic cause in the father that was not passed on to Rogers (in which case, follow up would not be warranted), or if the cause of sudden cardiac death in this family is unknown at this time and could be in an otherwise not yet discovered gene (in which case, ongoing cardiology follow up is recommended).  A third possible result is variant of uncertain significance, meaning it is unknown if the variant is associated with symptoms/increased risk or not. In the case of a negative or uncertain result, we would recommend that Matthew Ibarra continue to be followed by his cardiologist at intervals designated by them.  Regarding the mother's recent diagnosis of breast cancer and a possible genetic variant, we discussed that genetic testing for other family members (including Matthew Ibarra and his sister) may be performed depending on her result. We encourage his mother to obtain a copy of this report and if appropriate reach out to the provider who performed her genetic testing to coordinate this. Alternatively, there are cancer genetic counselors at [facility] that can perform testing. Our clinic is also able to provide counseling and family testing if desired.  Recommendations: Comprehensive Arrhythmia and Cardiomyopathy panel to [facility] Obtain copy of mom's genetic test report re: breast cancer  A buccal sample was obtained during today's visit for the above genetic testing and sent to [facility]. Results are anticipated in 2-4 weeks. We will contact the family to discuss results once available and arrange follow-up as needed.    Heidi Dach, MS, [facility] Certified Genetic Counselor  Artist Pais, D.O. Attending Physician, [facility] Date: 06/01/2022 Time: 3:19pm   Total time spent: 40 minutes Time spent includes face to face and non-face to face care for the patient on the date of this  encounter (history and physical, genetic counseling, coordination of care, data gathering and/or documentation as outlined)

## 2022-05-27 ENCOUNTER — Ambulatory Visit (INDEPENDENT_AMBULATORY_CARE_PROVIDER_SITE_OTHER): Payer: No Typology Code available for payment source | Admitting: Pediatric Genetics

## 2022-05-27 ENCOUNTER — Encounter (INDEPENDENT_AMBULATORY_CARE_PROVIDER_SITE_OTHER): Payer: Self-pay | Admitting: Pediatric Genetics

## 2022-05-27 VITALS — Ht 70.55 in | Wt 165.0 lb

## 2022-05-27 DIAGNOSIS — Z8241 Family history of sudden cardiac death: Secondary | ICD-10-CM

## 2022-05-27 NOTE — Patient Instructions (Signed)
At [facility], we are committed to providing exceptional care. You will receive a patient satisfaction survey through text or email regarding your visit today. Your opinion is important to me. Comments are appreciated.  Test ordered: Comprehensive Arrhythmia and Cardiomyopathy panel to [facility]  Result expected in 2-4 weeks  

## 2022-06-02 ENCOUNTER — Telehealth: Payer: Self-pay | Admitting: Family Medicine

## 2022-06-02 ENCOUNTER — Other Ambulatory Visit (HOSPITAL_COMMUNITY): Payer: Self-pay

## 2022-06-02 ENCOUNTER — Other Ambulatory Visit: Payer: Self-pay | Admitting: Family Medicine

## 2022-06-02 MED ORDER — LISDEXAMFETAMINE DIMESYLATE 40 MG PO CAPS
40.0000 mg | ORAL_CAPSULE | Freq: Every morning | ORAL | 0 refills | Status: DC
Start: 2022-06-02 — End: 2022-07-20
  Filled 2022-06-02: qty 30, 30d supply, fill #0

## 2022-06-02 NOTE — Telephone Encounter (Signed)
Nurses-I will send in rx, he must schedule appt with [place] for ADD meds last seen December (This is a conrolled med and needs follow up )

## 2022-06-03 ENCOUNTER — Other Ambulatory Visit (HOSPITAL_COMMUNITY): Payer: Self-pay

## 2022-06-03 ENCOUNTER — Encounter: Payer: Self-pay | Admitting: *Deleted

## 2022-06-03 NOTE — Telephone Encounter (Signed)
Tried to call all 3 numbers listed. No answer or vm.  Sent MyChart message to pt.

## 2022-07-05 ENCOUNTER — Other Ambulatory Visit (HOSPITAL_COMMUNITY): Payer: Self-pay

## 2022-07-05 ENCOUNTER — Telehealth: Payer: Self-pay | Admitting: Family Medicine

## 2022-07-05 ENCOUNTER — Other Ambulatory Visit: Payer: Self-pay | Admitting: Family Medicine

## 2022-07-05 MED ORDER — LISDEXAMFETAMINE DIMESYLATE 40 MG PO CAPS
40.0000 mg | ORAL_CAPSULE | ORAL | 0 refills | Status: DC
  Filled 2022-07-05: qty 30, 30d supply, fill #0

## 2022-07-05 NOTE — Telephone Encounter (Signed)
Patient is requesting refill on lisdexamfetamine 40 mg called into [facility] out patient pharmacy, He has in-person appointment 07/20/22 for medication follow up. But only has 3 pills left and has went back to college.

## 2022-07-05 NOTE — Telephone Encounter (Signed)
Refill was sent in we will see him for follow-up visit as planned

## 2022-07-20 ENCOUNTER — Ambulatory Visit (INDEPENDENT_AMBULATORY_CARE_PROVIDER_SITE_OTHER): Payer: No Typology Code available for payment source | Admitting: Family Medicine

## 2022-07-20 ENCOUNTER — Other Ambulatory Visit (HOSPITAL_COMMUNITY): Payer: Self-pay

## 2022-07-20 VITALS — BP 106/74 | HR 72 | Temp 97.3°F | Ht 71.0 in | Wt 167.0 lb

## 2022-07-20 DIAGNOSIS — F988 Other specified behavioral and emotional disorders with onset usually occurring in childhood and adolescence: Secondary | ICD-10-CM | POA: Diagnosis not present

## 2022-07-20 MED ORDER — LISDEXAMFETAMINE DIMESYLATE 40 MG PO CAPS
40.0000 mg | ORAL_CAPSULE | ORAL | 0 refills | Status: DC
  Filled 2022-07-20: qty 30, 30d supply, fill #0

## 2022-07-20 MED ORDER — LISDEXAMFETAMINE DIMESYLATE 40 MG PO CAPS
40.0000 mg | ORAL_CAPSULE | ORAL | 0 refills | Status: DC
Start: 2022-08-04 — End: 2022-08-16
  Filled 2022-07-20: qty 30, 30d supply, fill #0

## 2022-07-20 MED ORDER — LISDEXAMFETAMINE DIMESYLATE 40 MG PO CAPS
40.0000 mg | ORAL_CAPSULE | Freq: Every morning | ORAL | 0 refills | Status: DC
  Filled 2022-07-20: qty 30, 30d supply, fill #0

## 2022-07-20 NOTE — Progress Notes (Signed)
   Subjective:    Patient ID: Matthew Ibarra, male    DOB: [birth year], 21 y.o.   MRN: [id number]  HPI This patient has adult ADD. Takes medication responsibly. Medication does help the patient focus in be more functional. Patient relates that they are or not abusing the medication or misusing the medication. The patient understands that if they're having any negative side effects such as elevated high blood pressure severe headaches they would need stop the medication follow-up immediately. They also understand that the prescriptions are to last for 3 months then the patient will need to follow-up before having further prescriptions.  Patient compliance daily  Does medication help patient function /attention better  good  Side effects  none   Review of Systems     Objective:   Physical Exam  General-in no acute distress Eyes-no discharge Lungs-respiratory rate normal, CTA CV-no murmurs,RRR Extremities skin warm dry no edema Neuro grossly normal Behavior normal, alert       Assessment & Plan:  ADD Doing well with medicine Uses it on school days but sometimes on the weekend 3 prescriptions were sent to [facility] pharmacy If he needs him transferred he will let us know Recommend follow-up December or January When he needs a fourth prescription to please notify us

## 2022-08-04 ENCOUNTER — Telehealth (INDEPENDENT_AMBULATORY_CARE_PROVIDER_SITE_OTHER): Payer: Self-pay | Admitting: Pediatric Genetics

## 2022-08-04 NOTE — Telephone Encounter (Signed)
  Name of who is calling: Matthew Ibarra  Caller's Relationship to Patient: self  Best contact number: [phone]  Provider they see: Retta Mac  Reason for call: Would like a phone call to discuss his genetics results to go over them.      PRESCRIPTION REFILL ONLY  Name of prescription:  Pharmacy:

## 2022-08-05 NOTE — Telephone Encounter (Signed)
Returned Matthew Ibarra's call to discuss result of genetic testing. Matthew Ibarra was tested through the [facility] Arrhythmia and Cardiomyopathy Comprehensive Panel (100 genes). No pathogenic variants were identified.  There were two variants of uncertain significance: one in SCN5A and one in TTN. Variants of uncertain significance (VUS) represent alterations in a gene that have not been seen with sufficient frequency to know with certainty whether or not they contribute to a specific cause of disease. Over time as more is learned about the variant, the lab will hopefully be able to classify the variant as either harmless (benign) or disease causing (pathogenic). The vast majority of VUS are eventually reclassified as benign. Typically, unless there are symptoms suggestive otherwise, VUS do not change management. Matthew Ibarra should continue to follow his cardiologist's recommendations for management based on his family history.  We would like to see Matthew Ibarra for a follow up appointment to discuss this result in further detail. A virtual appointment will be scheduled for 11/8 at 4pm.  A copy of the result will be scanned into his chart.

## 2022-08-16 ENCOUNTER — Encounter: Payer: Self-pay | Admitting: Family Medicine

## 2022-08-16 ENCOUNTER — Other Ambulatory Visit: Payer: Self-pay | Admitting: Family Medicine

## 2022-08-16 MED ORDER — LISDEXAMFETAMINE DIMESYLATE 40 MG PO CAPS
40.0000 mg | ORAL_CAPSULE | ORAL | 0 refills | Status: DC
  Filled 2022-09-27: qty 30, 30d supply, fill #0

## 2022-08-16 MED ORDER — LISDEXAMFETAMINE DIMESYLATE 40 MG PO CAPS
40.0000 mg | ORAL_CAPSULE | ORAL | 0 refills | Status: DC
  Filled 2022-08-16: qty 30, 30d supply, fill #0

## 2022-08-16 MED ORDER — LISDEXAMFETAMINE DIMESYLATE 40 MG PO CAPS
40.0000 mg | ORAL_CAPSULE | Freq: Every morning | ORAL | 0 refills | Status: DC
  Filled 2022-11-10: qty 30, 30d supply, fill #0

## 2022-08-17 ENCOUNTER — Other Ambulatory Visit: Payer: Self-pay

## 2022-08-28 ENCOUNTER — Telehealth: Payer: No Typology Code available for payment source | Admitting: Family Medicine

## 2022-08-28 DIAGNOSIS — J309 Allergic rhinitis, unspecified: Secondary | ICD-10-CM

## 2022-08-28 MED ORDER — PREDNISONE 20 MG PO TABS
20.0000 mg | ORAL_TABLET | Freq: Two times a day (BID) | ORAL | 0 refills | Status: DC
  Filled 2022-08-28: qty 10, 5d supply, fill #0

## 2022-08-28 MED ORDER — PREDNISONE 20 MG PO TABS: 20.0000 mg | ORAL_TABLET | Freq: Two times a day (BID) | ORAL | 0 refills | Status: AC

## 2022-08-28 NOTE — Addendum Note (Signed)
Addended by: Mar Daring on: 08/28/2022 11:34 AM   Modules accepted: Orders

## 2022-08-28 NOTE — Progress Notes (Signed)
E visit for Allergic Rhinitis We are sorry that you are not feeling well.  Here is how we plan to help!  Based on what you have shared with me it looks like you have Allergic Rhinitis.  Rhinitis is when a reaction occurs that causes nasal congestion, runny nose, sneezing, and itching.  Most types of rhinitis are caused by an inflammation and are associated with symptoms in the eyes ears or throat. There are several types of rhinitis.  The most common are acute rhinitis, which is usually caused by a viral illness, allergic or seasonal rhinitis, and nonallergic or year-round rhinitis.  Nasal allergies occur certain times of the year.  Allergic rhinitis is caused when allergens in the air trigger the release of histamine in the body.  Histamine causes itching, swelling, and fluid to build up in the fragile linings of the nasal passages, sinuses and eyelids.  An itchy nose and clear discharge are common.  I will send you prednisone to take however please continue your allergy medications as you are now daily.   HOME CARE:  You can use an over-the-counter saline nasal spray as needed Avoid areas where there is heavy dust, mites, or molds Stay indoors on windy days during the pollen season Keep windows closed in home, at least in bedroom; use air conditioner. Use high-efficiency house air filter Keep windows closed in car, turn AC on re-circulate Avoid playing out with dog during pollen season  GET HELP RIGHT AWAY IF:  If your symptoms do not improve within 10 days You become short of breath You develop yellow or green discharge from your nose for over 3 days You have coughing fits  MAKE SURE YOU:  Understand these instructions Will watch your condition Will get help right away if you are not doing well or get worse  Thank you for choosing an e-visit. Your e-visit answers were reviewed by a board certified advanced clinical practitioner to complete your personal care plan. Depending upon  the condition, your plan could have included both over the counter or prescription medications. Please review your pharmacy choice. Be sure that the pharmacy you have chosen is open so that you can pick up your prescription now.  If there is a problem you may message your provider in [place] to have the prescription routed to another pharmacy. Your safety is important to [place]. If you have drug allergies check your prescription carefully.  For the next 24 hours, you can use MyChart to ask questions about today's visit, request a non-urgent call back, or ask for a work or school excuse from your e-visit provider. You will get an email in the next two days asking about your experience. I hope that your e-visit has been valuable and will speed your recovery.  I have provided 5 minutes of non face to face time during this encounter for chart review and documentation.

## 2022-08-29 ENCOUNTER — Other Ambulatory Visit: Payer: Self-pay

## 2022-08-31 ENCOUNTER — Telehealth: Payer: No Typology Code available for payment source | Admitting: Family Medicine

## 2022-09-02 ENCOUNTER — Telehealth (INDEPENDENT_AMBULATORY_CARE_PROVIDER_SITE_OTHER): Payer: No Typology Code available for payment source | Admitting: Family Medicine

## 2022-09-02 DIAGNOSIS — B349 Viral infection, unspecified: Secondary | ICD-10-CM

## 2022-09-02 DIAGNOSIS — H6992 Unspecified Eustachian tube disorder, left ear: Secondary | ICD-10-CM

## 2022-09-02 NOTE — Progress Notes (Signed)
   Subjective:    Patient ID: Matthew Ibarra, male    DOB: 2000/12/15, 21 y.o.   MRN: [id number]  HPI Virtual Visit via Video Note  I connected with [name] on 09/02/22 at  4:10 PM EDT by a video enabled telemedicine application and verified that I am speaking with the correct person using two identifiers.  Location: Patient: At [facility] Provider: Office   I discussed the limitations of evaluation and management by telemedicine and the availability of in person appointments. The patient expressed understanding and agreed to proceed.  History of Present Illness:    Observations/Objective:   Assessment and Plan:   Follow Up Instructions:    I discussed the assessment and treatment plan with the patient. The patient was provided an opportunity to ask questions and all were answered. The patient agreed with the plan and demonstrated an understanding of the instructions.   The patient was advised to call back or seek an in-person evaluation if the symptoms worsen or if the condition fails to improve as anticipated.  I provided 15 minutes of non-face-to-face time during this encounter.   Matthew Lange, MD  Patient has had head congestion drainage coughing bringing up some mucus denies high fever chills relates a lot of fullness in the ear and some discomfort but that is getting better now Patient denies high fever chills sweats Review of Systems     Objective:   Physical Exam  On video he does not appear to be in any distress pleasant interactive      Assessment & Plan:   He was seen  at e visit was prescribed prednisone We did discuss using steroid nasal spray for his left nare congestion and left ear fullness to see if it will help open up the eustachian tube no need for antibiotics currently if it does not improve over the course the next 7 days recommend in person visit to evaluate or student health at [facility] off on antibiotics

## 2022-09-22 ENCOUNTER — Encounter (INDEPENDENT_AMBULATORY_CARE_PROVIDER_SITE_OTHER): Payer: Self-pay | Admitting: Pediatric Genetics

## 2022-09-22 ENCOUNTER — Telehealth (INDEPENDENT_AMBULATORY_CARE_PROVIDER_SITE_OTHER): Payer: No Typology Code available for payment source | Admitting: Pediatric Genetics

## 2022-09-22 DIAGNOSIS — Z8241 Family history of sudden cardiac death: Secondary | ICD-10-CM | POA: Diagnosis not present

## 2022-09-22 DIAGNOSIS — R898 Other abnormal findings in specimens from other organs, systems and tissues: Secondary | ICD-10-CM | POA: Diagnosis not present

## 2022-09-22 NOTE — Progress Notes (Signed)
MEDICAL GENETICS TELEMEDICINE FOLLOW-UP VISIT  This is a Pediatric Specialist E-Visit follow up consult provided via Caregility Audie Clear consented to an E-Visit consult today.  Location of patient: [facility] at home in [place], [place] Location of provider: Dr. Roetta Sessions at the [facility]ists office in [place], [place]   The following participants were involved in this E-Visit:  DaShaunia Ridenhour Engineer, building services) Dr. Roetta Sessions (Pediatric Geneticist) Charline Bills (Genetic counselor) Haskell Flirt (Genetic counselor intern) Matthew Ibarra (patient)   Total time on video call: 16 min   Patient name: Matthew Ibarra DOB: 08-Mar-2001 Age: 21 y.o. MRN: [id number]  Initial Referring Provider/Specialty: Lilyan Punt, MD / Pediatrics  Date of Evaluation: 09/22/2022 Chief Complaint/Reason for Referral: Review results of genetic testing  HPI: Matthew Ibarra is a 21 y.o. male who presents today for follow-up with Genetics to review results of genetic testing. He is unaccompanied.  To review, their initial visit was on 05/27/2022 at 21 years old for family history of sudden cardiac death in his father and some other paternal family members. Matthew Ibarra himself is in good health. He has ADHD but is developmentally and intellectually appropriate otherwise and in good health. His own Cardiology evaluation winter of 2022 showed normal sinus rhythm with RBBB and 1st degree AV block and normal QTc on EKG. ECHO showed his right ventricle is upper normal in size with normal systolic function, otherwise normal. He has no dysmorphic features.   We recommended the [facility] Comprehensive Arrhythmia and Cardiomyopathy panel which showed one variant of uncertain significance in SCN5A (c.1338G>C (p.Glu446Asp)) and one variant of uncertain significance in TTN (c.55303-1G>A (Splice acceptor)). He returns today to discuss these results.  Since that visit, Matthew Ibarra has been in good health. He has a follow-up appointment with  [facility] 12/2022 (Dr. Luretha Murphy).  Past Medical History: Past Medical History:  Diagnosis Date   Asthma 08/17/2011   hx of, mainly exercise induced   Seasonal allergies 08/17/2011   Wrist fracture, left    Patient Active Problem List   Diagnosis Date Noted   ADD (attention deficit disorder) 10/18/2019   Allergic rhinitis 11/13/2012   Headache(784.0) 11/13/2012   Asthma 08/17/2011   Seasonal allergies 08/17/2011   Past Surgical History:  Past Surgical History:  Procedure Laterality Date   NO PAST SURGERIES     Social History: Social History   Social History Narrative   Fall semester of senior year at [employer]    Medications: Current Outpatient Medications on File Prior to Visit  Medication Sig Dispense Refill   [START ON 10/01/2022] lisdexamfetamine (VYVANSE) 40 MG capsule Take 1 capsule (40 mg total) by mouth every morning. 30 capsule 0   lisdexamfetamine (VYVANSE) 40 MG capsule Take 1 capsule (40 mg total) by mouth every morning. 30 capsule 0   lisdexamfetamine (VYVANSE) 40 MG capsule Take 1 capsule (40 mg total) by mouth every morning. 30 capsule 0   loratadine (CLARITIN) 10 MG tablet Take 10 mg by mouth daily.     Melatonin 1 MG TABS Take by mouth at bedtime. Takes 2 - 3 tablets     VITAMIN D PO Take by mouth.     No current facility-administered medications on file prior to visit.    Allergies:  No Known Allergies  Immunizations: Up to date  Review of Systems (updates in bold): General: healthy, active. Eyes/vision: no concerns. Ears/hearing: no concerns. Dental: no concerns. Respiratory: no concerns. Cardiovascular: EKG (09/2021)- right bundle branch block and 1st degree  AV Block. ECHO normal (10/2021)- right ventricle upper normal in size. Next visit 12/2022. Gastrointestinal: no concerns. Genitourinary: no concerns. Endocrine: no concerns. Hematologic: no concerns. Immunologic: no concerns. Neurological: no concerns. Psychiatric: recent  death of father 09-13-21). Mother recently diagnosed with stage 0 breast cancer- undergoing radiation. Musculoskeletal: no concerns. Skin, Hair, Nails: no concerns.  Family History: No updates to family history since last visit. We do not have records for any genetic testing mom may have had pertaining to breast cancer.  Physical Examination: There were no vitals taken for this visit.  General: Alert, interactive Head: Normocephalic Eyes: Normoset, Normal lids, lashes, brows Nose: Normal appearance Lips/Mouth/Teeth: Normal appearance Lungs: No increased work of breathing Psych: Age-appropriate interactions and asks good questions, appropriate mood and affect  Updated Genetic testing: Comprehensive Arrhythmia and Cardiomyopathy panel ([facility]):   Pertinent New Labs: None  Pertinent New Imaging/Studies: None  Assessment: Matthew Ibarra is a 21 y.o. male whose family history is significant for sudden cardiac death in his father and other paternal relatives. Matthew Ibarra himself is in good health overall. He has ADHD but is developmentally and intellectually appropriate otherwise and in good health. His own Cardiology evaluation last winter showed normal sinus rhythm with RBBB and 1st degree AV block and normal QTc on EKG. ECHO showed his right ventricle is upper normal in size with normal systolic function, otherwise normal. He has no dysmorphic features.   Matthew Ibarra was tested through the [facility] Arrhythmia and Cardiomyopathy Comprehensive Panel (100 genes). No pathogenic variants were identified to definitively diagnose him with a particular genetic risk.   There were two variants of uncertain significance: one in SCN5A and one in TTN. Variants of uncertain significance (VUS) represent alterations in a gene that have not been seen with sufficient frequency to know with certainty whether or not they contribute to a specific cause of disease. Over time as more is learned about the variant,  the lab will hopefully be able to classify the variant as either harmless (benign) or disease causing (pathogenic). The vast majority of VUS are eventually reclassified as benign. Typically, unless there are symptoms suggestive otherwise, VUS do not change management. Matthew Ibarra should continue to follow his cardiologist's recommendations for management based on his family history. These two VUS also do not qualify for free parental testing and unfortunately, Matthew Ibarra's father is deceased.  Pathogenic variants in SCN5A are associated with autosomal dominant Brugada syndrome, long QT syndrome type 3, dilated cardiomyopathy, and atrial fibrillation. Pathogenic variants in TTN are associated with autosomal dominant dilated cardiomyopathy, in addition to various conditions affecting the skeletal muscles. Given this, we recommend that Matthew Ibarra's cardiac surveillance include EKG and ECHO to assess for signs specific to these conditions, in addition to any other relevant heart findings. If Matthew Ibarra were to develop symptoms of one of these specific conditions, we ask that he update the Genetics team as this may be helpful in interpreting the clinical significance of these variants.   Recommendations: Continue routine lifelong Cardiology surveillance due to risk of arrhythmias and cardiomyopathy based on family history and genetic test result uncertainty Separate Genetics evaluation for his sister (appt made 10/2022). Can ask mom about her own genetic testing for breast cancer at that visit.   Matthew Dach, MS, [facility] Certified Genetic Counselor  Matthew Ibarra, D.O. Attending Physician Medical Genetics Date: 09/28/2022 Time: 2:46pm  Total time spent: 20 minutes Time spent includes face to face and non-face to face care for the patient on the date of this encounter (  history and physical, genetic counseling, coordination of care, data gathering and/or documentation as outlined)

## 2022-09-24 ENCOUNTER — Other Ambulatory Visit: Payer: Self-pay

## 2022-09-27 ENCOUNTER — Other Ambulatory Visit: Payer: Self-pay

## 2022-09-28 NOTE — Patient Instructions (Signed)
Matthew Ibarra is a 21 y.o. male whose family history is significant for sudden cardiac death in his father and other paternal relatives. Pilar himself is in good health overall. He has ADHD but is developmentally and intellectually appropriate otherwise and in good health. His own Cardiology evaluation last winter showed normal sinus rhythm with RBBB and 1st degree AV block and normal QTc on EKG. ECHO showed his right ventricle is upper normal in size with normal systolic function, otherwise normal. He has no dysmorphic features.   Candon was tested through the [facility] Arrhythmia and Cardiomyopathy Comprehensive Panel (100 genes). No pathogenic variants were identified to definitively diagnose him with a particular genetic risk.   There were two variants of uncertain significance: one in SCN5A and one in TTN. Variants of uncertain significance (VUS) represent alterations in a gene that have not been seen with sufficient frequency to know with certainty whether or not they contribute to a specific cause of disease. Over time as more is learned about the variant, the lab will hopefully be able to classify the variant as either harmless (benign) or disease causing (pathogenic). The vast majority of VUS are eventually reclassified as benign. Typically, unless there are symptoms suggestive otherwise, VUS do not change management. Nissim should continue to follow his cardiologist's recommendations for management based on his family history. These two VUS also do not qualify for free parental testing and unfortunately, Samer's father is deceased.  Pathogenic variants in SCN5A are associated with autosomal dominant Brugada syndrome, long QT syndrome type 3, dilated cardiomyopathy, and atrial fibrillation. Pathogenic variants in TTN are associated with  autosomal dominant dilated cardiomyopathy, in addition to various conditions affecting the skeletal muscles. Given this, we recommend that Dirck's cardiac surveillance  include EKG and ECHO to assess for signs specific to these conditions, in addition to any other relevant heart findings. If Kengo were to develop symptoms of one of these specific conditions, we ask that he update the Genetics team as this may be helpful in interpreting the clinical significance of these variants.  Recommendations: Continue routine lifelong Cardiology surveillance due to risk of arrhythmias and cardiomyopathy based on family history and genetic test result uncertainty Separate Genetics evaluation for his sister (appt made 10/2022). Can ask mom about her own genetic testing for breast cancer at that visit.

## 2022-11-10 ENCOUNTER — Other Ambulatory Visit: Payer: Self-pay

## 2022-12-29 ENCOUNTER — Other Ambulatory Visit: Payer: Self-pay | Admitting: Family Medicine

## 2022-12-29 ENCOUNTER — Other Ambulatory Visit: Payer: Self-pay

## 2022-12-29 MED ORDER — LISDEXAMFETAMINE DIMESYLATE 30 MG PO CAPS
30.0000 mg | ORAL_CAPSULE | Freq: Every day | ORAL | 0 refills | Status: DC
  Filled 2022-12-29: qty 30, 30d supply, fill #0

## 2023-01-02 ENCOUNTER — Encounter: Payer: Self-pay | Admitting: Family Medicine

## 2023-01-02 ENCOUNTER — Other Ambulatory Visit: Payer: Self-pay

## 2023-01-02 MED ORDER — LISDEXAMFETAMINE DIMESYLATE 40 MG PO CAPS
40.0000 mg | ORAL_CAPSULE | Freq: Every morning | ORAL | 0 refills | Status: DC
  Filled 2023-01-02 – 2023-01-19 (×5): qty 30, 30d supply, fill #0

## 2023-01-03 ENCOUNTER — Other Ambulatory Visit: Payer: Self-pay

## 2023-01-12 ENCOUNTER — Other Ambulatory Visit: Payer: Self-pay

## 2023-01-13 ENCOUNTER — Other Ambulatory Visit: Payer: Self-pay

## 2023-01-19 ENCOUNTER — Telehealth: Payer: Self-pay

## 2023-01-19 ENCOUNTER — Other Ambulatory Visit: Payer: Self-pay

## 2023-01-19 NOTE — Telephone Encounter (Signed)
error 

## 2023-01-28 ENCOUNTER — Other Ambulatory Visit: Payer: Self-pay

## 2023-02-01 ENCOUNTER — Other Ambulatory Visit: Payer: Self-pay

## 2023-02-02 ENCOUNTER — Other Ambulatory Visit: Payer: Self-pay

## 2023-04-06 ENCOUNTER — Other Ambulatory Visit: Payer: Self-pay

## 2023-04-06 ENCOUNTER — Other Ambulatory Visit: Payer: Self-pay | Admitting: Family Medicine

## 2023-04-07 ENCOUNTER — Other Ambulatory Visit: Payer: Self-pay | Admitting: Family Medicine

## 2023-04-07 ENCOUNTER — Other Ambulatory Visit: Payer: Self-pay

## 2023-04-07 ENCOUNTER — Encounter: Payer: Self-pay | Admitting: Family Medicine

## 2023-04-07 MED FILL — Lisdexamfetamine Dimesylate Cap 40 MG: ORAL | 30 days supply | Qty: 30 | Fill #0 | Status: AC

## 2023-04-08 ENCOUNTER — Other Ambulatory Visit: Payer: Self-pay

## 2023-04-12 ENCOUNTER — Other Ambulatory Visit: Payer: Self-pay

## 2023-05-03 ENCOUNTER — Ambulatory Visit (INDEPENDENT_AMBULATORY_CARE_PROVIDER_SITE_OTHER): Payer: 59 | Admitting: Family Medicine

## 2023-05-03 ENCOUNTER — Other Ambulatory Visit: Payer: Self-pay

## 2023-05-03 VITALS — BP 123/77 | HR 82 | Wt 159.0 lb

## 2023-05-03 DIAGNOSIS — F988 Other specified behavioral and emotional disorders with onset usually occurring in childhood and adolescence: Secondary | ICD-10-CM | POA: Diagnosis not present

## 2023-05-03 MED ORDER — LISDEXAMFETAMINE DIMESYLATE 40 MG PO CAPS
40.0000 mg | ORAL_CAPSULE | ORAL | 0 refills | Status: DC
  Filled 2023-05-03 – 2023-08-05 (×2): qty 30, 30d supply, fill #0

## 2023-05-03 MED ORDER — LISDEXAMFETAMINE DIMESYLATE 40 MG PO CAPS
40.0000 mg | ORAL_CAPSULE | ORAL | 0 refills | Status: DC
  Filled 2023-05-03 – 2023-06-23 (×2): qty 30, 30d supply, fill #0

## 2023-05-03 MED ORDER — LISDEXAMFETAMINE DIMESYLATE 40 MG PO CAPS
40.0000 mg | ORAL_CAPSULE | Freq: Every morning | ORAL | 0 refills | Status: DC
  Filled 2023-05-03 – 2023-09-19 (×2): qty 30, 30d supply, fill #0

## 2023-05-03 NOTE — Progress Notes (Signed)
   Subjective:    Patient ID: Matthew Ibarra, male    DOB: 20-Jan-2001, 22 y.o.   MRN: [id number]  HPI This patient has adult ADD. Takes medication responsibly. Medication does help the patient focus in be more functional. Patient relates that they are or not abusing the medication or misusing the medication. The patient understands that if they're having any negative side effects such as elevated high blood pressure severe headaches they would need stop the medication follow-up immediately. They also understand that the prescriptions are to last for 3 months then the patient will need to follow-up before having further prescriptions.  Patient compliance yes  Does medication help patient function /attention better  yes  Side effects  no    Review of Systems     Objective:   Physical Exam General-in no acute distress Eyes-no discharge Lungs-respiratory rate normal, CTA CV-no murmurs,RRR Extremities skin warm dry no edema Neuro grossly normal Behavior normal, alert        Assessment & Plan:  The patient was seen today as part of the visit regarding ADD.  Patient is stable on current regimen.  Appropriate prescriptions prescribed.  Medications were reviewed with the patient as well as compliance. Side effects were checked for. Discussion regarding effectiveness was held. Prescriptions were electronically sent in.  Patient reminded to follow-up in approximately 3 months.   Plans to [facility] law with drug registry was checked and verified while present with the patient.  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

## 2023-06-23 ENCOUNTER — Other Ambulatory Visit: Payer: Self-pay

## 2023-08-05 ENCOUNTER — Other Ambulatory Visit: Payer: Self-pay

## 2023-09-06 ENCOUNTER — Telehealth: Payer: 59 | Admitting: Family Medicine

## 2023-09-06 ENCOUNTER — Ambulatory Visit: Payer: 59 | Admitting: Family Medicine

## 2023-09-06 VITALS — BP 132/74 | HR 77 | Wt 164.4 lb

## 2023-09-06 DIAGNOSIS — S61411A Laceration without foreign body of right hand, initial encounter: Secondary | ICD-10-CM | POA: Diagnosis not present

## 2023-09-06 NOTE — Progress Notes (Unsigned)
Subjective:    Patient ID: Matthew Ibarra, male    DOB: Mar 18, 2001, 22 y.o.   MRN: [id number]  HPI Patient sustained a laceration to the hand accidentally when a piece of glass broke His mom helped him take care of it and is healed up except for an area in the skin where the skin is thickened with signs of it being dead as well as diminished sensation The laceration was at a angle with a flap at the end of the V Denies any peripheral numbness tingling or problems with moving the hand Up-to-date on tetanus   Review of Systems     Objective:   Physical Exam Please see discussion above No sign of infection currently Does appear to have some dead skin Patient sustained a V shaped flap cut He self doctored There is some areas that the skin is not quite approximating to the other skin but is healing in by secondary intention With a V shape points distally, with the base proximal The proximal portion does appear to be healing and has normal sensation  The distal portion of the V is white and has the appearance of a triangle with the base proximally and the apex distally and is approximately three fourths of a centimeter at the base.      Assessment & Plan:   Will touch base with hand specialist to see if they recommend cutting away the statin scan or just watching More than likely it will heal and from the inside out underneath the flap But if this area being approximately 1 cm at the base of the triangle it is possible this dead skin needs to be debrided Will see what orthopedics recommends and then go from there May resume work but keep covered when working work note given if necessary they will do light duty

## 2023-09-08 ENCOUNTER — Encounter: Payer: Self-pay | Admitting: Family Medicine

## 2023-09-19 ENCOUNTER — Other Ambulatory Visit: Payer: Self-pay

## 2023-11-22 ENCOUNTER — Other Ambulatory Visit: Payer: Self-pay | Admitting: Family Medicine

## 2023-11-22 ENCOUNTER — Other Ambulatory Visit: Payer: Self-pay

## 2023-11-22 ENCOUNTER — Encounter: Payer: Self-pay | Admitting: Family Medicine

## 2023-11-22 MED ORDER — LISDEXAMFETAMINE DIMESYLATE 40 MG PO CAPS
40.0000 mg | ORAL_CAPSULE | Freq: Every morning | ORAL | 0 refills | Status: DC
  Filled 2023-11-22: qty 30, 30d supply, fill #0

## 2023-12-19 ENCOUNTER — Ambulatory Visit: Payer: 59 | Admitting: Family Medicine

## 2023-12-19 ENCOUNTER — Other Ambulatory Visit: Payer: Self-pay

## 2023-12-19 VITALS — BP 110/76 | HR 80 | Temp 98.4°F | Ht 71.0 in | Wt 162.0 lb

## 2023-12-19 DIAGNOSIS — F988 Other specified behavioral and emotional disorders with onset usually occurring in childhood and adolescence: Secondary | ICD-10-CM | POA: Diagnosis not present

## 2023-12-19 MED ORDER — LISDEXAMFETAMINE DIMESYLATE 40 MG PO CAPS
40.0000 mg | ORAL_CAPSULE | Freq: Every morning | ORAL | 0 refills | Status: DC
  Filled 2023-12-19 – 2024-02-20 (×2): qty 30, 30d supply, fill #0

## 2023-12-19 MED ORDER — LISDEXAMFETAMINE DIMESYLATE 40 MG PO CAPS
40.0000 mg | ORAL_CAPSULE | ORAL | 0 refills | Status: DC
  Filled 2023-12-19 – 2024-01-09 (×3): qty 30, 30d supply, fill #0

## 2023-12-19 MED ORDER — LISDEXAMFETAMINE DIMESYLATE 40 MG PO CAPS
40.0000 mg | ORAL_CAPSULE | ORAL | 0 refills | Status: DC
  Filled 2023-12-19 – 2024-04-16 (×3): qty 30, 30d supply, fill #0

## 2023-12-19 NOTE — Progress Notes (Signed)
   Subjective:    Patient ID: Matthew Ibarra, male    DOB: Feb 07, 2001, 23 y.o.   MRN: [id number]  HPI  This patient has adult ADD. Takes medication responsibly. Medication does help the patient focus in be more functional. Patient relates that they are or not abusing the medication or misusing the medication. The patient understands that if they're having any negative side effects such as elevated high blood pressure severe headaches they would need stop the medication follow-up immediately. They also understand that the prescriptions are to last for 3 months then the patient will need to follow-up before having further prescriptions.  Patient compliance yes  Does medication help patient function /attention better  yes  Side effects  none   Review of Systems     Objective:   Physical Exam General-in no acute distress Eyes-no discharge Lungs-respiratory rate normal, CTA CV-no murmurs,RRR Extremities skin warm dry no edema Neuro grossly normal Behavior normal, alert        Assessment & Plan:  The patient was seen today as part of the visit regarding ADD.  Patient is stable on current regimen.  Appropriate prescriptions prescribed.  Medications were reviewed with the patient as well as compliance. Side effects were checked for. Discussion regarding effectiveness was held. Prescriptions were electronically sent in.  Patient reminded to follow-up in approximately 3 months.   Plans to [facility] law with drug registry was checked and verified while present with the patient.  Lab work and comprehensive wellness in 4 months

## 2023-12-20 ENCOUNTER — Other Ambulatory Visit: Payer: Self-pay

## 2023-12-20 DIAGNOSIS — Z0000 Encounter for general adult medical examination without abnormal findings: Secondary | ICD-10-CM

## 2023-12-20 DIAGNOSIS — Z13220 Encounter for screening for lipoid disorders: Secondary | ICD-10-CM

## 2023-12-22 ENCOUNTER — Other Ambulatory Visit: Payer: Self-pay

## 2024-01-03 ENCOUNTER — Other Ambulatory Visit: Payer: Self-pay

## 2024-01-09 ENCOUNTER — Other Ambulatory Visit: Payer: Self-pay

## 2024-02-15 DIAGNOSIS — I2510 Atherosclerotic heart disease of native coronary artery without angina pectoris: Secondary | ICD-10-CM | POA: Diagnosis not present

## 2024-02-15 DIAGNOSIS — R898 Other abnormal findings in specimens from other organs, systems and tissues: Secondary | ICD-10-CM | POA: Diagnosis not present

## 2024-02-15 DIAGNOSIS — Z8241 Family history of sudden cardiac death: Secondary | ICD-10-CM | POA: Diagnosis not present

## 2024-02-15 DIAGNOSIS — I440 Atrioventricular block, first degree: Secondary | ICD-10-CM | POA: Diagnosis not present

## 2024-02-15 DIAGNOSIS — R9431 Abnormal electrocardiogram [ECG] [EKG]: Secondary | ICD-10-CM | POA: Diagnosis not present

## 2024-02-15 DIAGNOSIS — F909 Attention-deficit hyperactivity disorder, unspecified type: Secondary | ICD-10-CM | POA: Diagnosis not present

## 2024-02-16 DIAGNOSIS — R9431 Abnormal electrocardiogram [ECG] [EKG]: Secondary | ICD-10-CM | POA: Diagnosis not present

## 2024-02-20 ENCOUNTER — Other Ambulatory Visit: Payer: Self-pay

## 2024-03-06 DIAGNOSIS — Z8241 Family history of sudden cardiac death: Secondary | ICD-10-CM | POA: Diagnosis not present

## 2024-03-06 DIAGNOSIS — R898 Other abnormal findings in specimens from other organs, systems and tissues: Secondary | ICD-10-CM | POA: Diagnosis not present

## 2024-03-16 ENCOUNTER — Ambulatory Visit (INDEPENDENT_AMBULATORY_CARE_PROVIDER_SITE_OTHER): Payer: Self-pay | Admitting: Mental Health

## 2024-03-16 DIAGNOSIS — F902 Attention-deficit hyperactivity disorder, combined type: Secondary | ICD-10-CM

## 2024-03-16 DIAGNOSIS — I440 Atrioventricular block, first degree: Secondary | ICD-10-CM | POA: Diagnosis not present

## 2024-03-16 NOTE — Progress Notes (Signed)
 [facility] Counselor Initial Adult Exam  Name: Matthew Ibarra Date: 03/16/2024 MRN: [id number] DOB: 2001/07/09 PCP: Matthew Brasil, MD  Time spent: 50 minutes  Reason for Visit /Presenting Problem: Patient reports online therapy about 2 years ago. He attends [employer], currently applying to med school. Father passed 2 years ago, feels he has more to process. His father died a heart attack while they were on vacation, he had to give him chest compressions. His father was a physician in [place] and his mother as well. 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  Reports a history of ADHD, some anxiety and depression at times. Feels his anxiety is more frequent.  Works out as an Archivist. Reports any life changes, moving, birthday etc brings up memories of his father and some depression and anxiety surfaces. Anxiety prior to ADHD. Diagnosed ADHD after 1st year of college, and is prescribed Vyvanse  by his PCP. Girlfriend is supportive. Uses THC, this increased around his father's passing to cope. He plans to decrease use toward stopping. Uses weed to relax.   Recommended he schedule therapy every 2 weeks, continue to see his family doctor.  Mental Status Exam:    Appearance:    Casual     Behavior:   Appropriate  Motor:   WNL  Speech/Language:    Clear and Coherent  Affect:   Full range   Mood:   Euthymic  Thought process:   Logical, linear, goal directed  Thought content:     WNL  Sensory/Perceptual disturbances:     none  Orientation:   x4  Attention:   Good  Concentration:   Good  Memory:   Intact  Fund of knowledge:    Consistent with age and development  Insight:     Good  Judgment:    Good  Impulse Control:   Good     Reported Symptoms:  anxiety, depressed mood, over thinking at times, some sleep disturbance at times, challenges with focus and attention, distractible(currently managed with Vyvanse )   Risk  Assessment: Danger to Self:  No Self-injurious Behavior: No Danger to Others: No Duty to Warn:no Physical Aggression / Violence:No  Access to Firearms a concern: No  Gang Involvement:No  Patient / guardian was educated about steps to take if suicide or homicide risk level increases between visits: yes While future psychiatric events cannot be accurately predicted, the patient does not currently require acute inpatient psychiatric care and does not currently meet [place]  involuntary commitment criteria.   Substance Abuse History: Current substance abuse: uses THC  Past Psychiatric History:   Previous psychological history is significant for ADHD Outpatient Providers: [facility] History of Psych Hospitalization: No  Psychological Testing: none  Abuse History: Victim- none  Family History:  Raised by both parents.  Matthew Ibarra, Matthew Ibarra (deceased) Siblings-age 38 Matthew Ibarra  Family History  Problem Relation Age of Onset   ADD / ADHD Mother    Hypertension Mother    Hypertension Father    Down syndrome Sister    Hypertension Maternal Grandmother    Hyperlipidemia Maternal Grandmother    Hypertension Maternal Grandfather    Hyperlipidemia Maternal Grandfather    Prostate cancer Maternal Grandfather    Hyperlipidemia Paternal Grandmother    Hypertension Paternal Grandmother     Living situation: the patient lives w/ gf in [place]  Sexual Orientation:  Straight  Relationship Status: single  Name of spouse /  other: gf- Matthew Ibarra- dating for 3 years             If a parent, number of children / ages: none  Support Systems; spouse friends  Surveyor, quantity Stress:  No   Income/Employment/Disability:  part time   Financial planner: No   Educational History: Education: BS in college  Recreation/Hobbies: work out, hiking, gaming  Stressors: interpersonal  Strengths:  Supportive Relationships, Family, and Friends  Barriers:  none   Legal History: Pending legal issue  / charges: none History of legal issue / charges: none  Medical History/Surgical History: Past Medical History:  Diagnosis Date   Asthma 08/17/2011   hx of, mainly exercise induced   Seasonal allergies 08/17/2011   Wrist fracture, left     Past Surgical History:  Procedure Laterality Date   NO PAST SURGERIES      Medications: Current Outpatient Medications  Medication Sig Dispense Refill   lisdexamfetamine (VYVANSE ) 40 MG capsule Take 1 capsule (40 mg total) by mouth every morning. 30 capsule 0   lisdexamfetamine (VYVANSE ) 40 MG capsule Take 1 capsule (40 mg total) by mouth every morning. 30 capsule 0   lisdexamfetamine (VYVANSE ) 40 MG capsule Take 1 capsule (40 mg total) by mouth every morning. 30 capsule 0   loratadine (CLARITIN) 10 MG tablet Take 10 mg by mouth daily.     Melatonin 1 MG TABS Take by mouth at bedtime. Takes 2 - 3 tablets     VITAMIN D PO Take by mouth.     No current facility-administered medications for this visit.    No Known Allergies  Diagnoses:  No diagnosis found.  Plan of Care: TBD   Matthew Ibarra, [facility]

## 2024-03-29 DIAGNOSIS — Z13220 Encounter for screening for lipoid disorders: Secondary | ICD-10-CM | POA: Diagnosis not present

## 2024-03-29 DIAGNOSIS — Z0000 Encounter for general adult medical examination without abnormal findings: Secondary | ICD-10-CM | POA: Diagnosis not present

## 2024-03-30 ENCOUNTER — Ambulatory Visit: Admitting: Mental Health

## 2024-03-30 DIAGNOSIS — F902 Attention-deficit hyperactivity disorder, combined type: Secondary | ICD-10-CM

## 2024-03-30 LAB — COMPREHENSIVE METABOLIC PANEL WITH GFR
ALT: 17 IU/L (ref 0–44)
AST: 23 IU/L (ref 0–40)
Albumin: 4.9 g/dL (ref 4.3–5.2)
Alkaline Phosphatase: 59 IU/L (ref 44–121)
BUN/Creatinine Ratio: 20 (ref 9–20)
BUN: 21 mg/dL — ABNORMAL HIGH (ref 6–20)
Bilirubin Total: 0.5 mg/dL (ref 0.0–1.2)
CO2: 23 mmol/L (ref 20–29)
Calcium: 9.8 mg/dL (ref 8.7–10.2)
Chloride: 103 mmol/L (ref 96–106)
Creatinine, Ser: 1.03 mg/dL (ref 0.76–1.27)
Globulin, Total: 2 g/dL (ref 1.5–4.5)
Glucose: 71 mg/dL (ref 70–99)
Potassium: 4.9 mmol/L (ref 3.5–5.2)
Sodium: 143 mmol/L (ref 134–144)
Total Protein: 6.9 g/dL (ref 6.0–8.5)
eGFR: 105 mL/min/{1.73_m2} (ref >59)

## 2024-03-30 LAB — CBC WITH DIFFERENTIAL/PLATELET
Basophils Absolute: 0.1 10*3/uL (ref 0.0–0.2)
Basos: 1 %
EOS (ABSOLUTE): 0.1 10*3/uL (ref 0.0–0.4)
Eos: 2 %
Hematocrit: 42.8 % (ref 37.5–51.0)
Hemoglobin: 14.2 g/dL (ref 13.0–17.7)
Immature Grans (Abs): 0 10*3/uL (ref 0.0–0.1)
Immature Granulocytes: 0 %
Lymphocytes Absolute: 1.6 10*3/uL (ref 0.7–3.1)
Lymphs: 23 %
MCH: 31.9 pg (ref 26.6–33.0)
MCHC: 33.2 g/dL (ref 31.5–35.7)
MCV: 96 fL (ref 79–97)
Monocytes Absolute: 0.6 10*3/uL (ref 0.1–0.9)
Monocytes: 9 %
Neutrophils Absolute: 4.5 10*3/uL (ref 1.4–7.0)
Neutrophils: 65 %
Platelets: 324 10*3/uL (ref 150–450)
RBC: 4.45 x10E6/uL (ref 4.14–5.80)
RDW: 11 % — ABNORMAL LOW (ref 11.6–15.4)
WBC: 6.9 10*3/uL (ref 3.4–10.8)

## 2024-03-30 LAB — LIPID PANEL
Chol/HDL Ratio: 2.9 ratio (ref 0.0–5.0)
Cholesterol, Total: 159 mg/dL (ref 100–199)
HDL: 54 mg/dL (ref >39)
LDL Chol Calc (NIH): 93 mg/dL (ref 0–99)
Triglycerides: 62 mg/dL (ref 0–149)
VLDL Cholesterol Cal: 12 mg/dL (ref 5–40)

## 2024-03-30 LAB — VITAMIN D 25 HYDROXY (VIT D DEFICIENCY, FRACTURES): Vit D, 25-Hydroxy: 30.4 ng/mL (ref 30.0–100.0)

## 2024-03-30 NOTE — Progress Notes (Addendum)
 [facility] psychotherapy note  Name: Matthew Ibarra Date: 03/30/24 MRN: [id number] DOB: 2001-03-25 PCP: Bennet Brasil, MD  Time spent: 49 minutes  Treatment:   ind. Therapy  Virtual Visit via Telehealth Note Connected with patient by a telemedicine/telehealth application, with their informed consent, and verified patient privacy and that I am speaking with the correct person using two identifiers. I discussed the limitations, risks, security and privacy concerns of performing psychotherapy and the availability of in person appointments. I also discussed with the patient that there may be a patient responsible charge related to this service. The patient expressed understanding and agreed to proceed. I discussed the treatment planning with the patient. The patient was provided an opportunity to ask questions and all were answered. The patient agreed with the plan and demonstrated an understanding of the instructions. The patient was advised to call  our office if  symptoms worsen or feel they are in a crisis state and need immediate contact.   Therapist Location: office Patient Location: home    Mental Status Exam:    Appearance:    Casual     Behavior:   Appropriate  Motor:   WNL  Speech/Language:    Clear and Coherent  Affect:   Full range   Mood:   Euthymic  Thought process:   Logical, linear, goal directed  Thought content:     WNL  Sensory/Perceptual disturbances:     none  Orientation:   x4  Attention:   Good  Concentration:   Good  Memory:   Intact  Fund of knowledge:    Consistent with age and development  Insight:     Good  Judgment:    Good  Impulse Control:   Good     Reported Symptoms:  anxiety, depressed mood, over thinking at times, some sleep disturbance at times, challenges with focus and attention, distractible(currently managed with Vyvanse )   Risk Assessment: Danger to Self:  No Self-injurious Behavior: No Danger to Others: No Duty to  Warn:no Physical Aggression / Violence:No  Access to Firearms a concern: No  Gang Involvement:No  Patient / guardian was educated about steps to take if suicide or homicide risk level increases between visits: yes While future psychiatric events cannot be accurately predicted, the patient does not currently require acute inpatient psychiatric care and does not currently meet [place]  involuntary commitment criteria.    Family History  Problem Relation Age of Onset   ADD / ADHD Mother    Hypertension Mother    Hypertension Father    Down syndrome Sister    Hypertension Maternal Grandmother    Hyperlipidemia Maternal Grandmother    Hypertension Maternal Grandfather    Hyperlipidemia Maternal Grandfather    Prostate cancer Maternal Grandfather    Hyperlipidemia Paternal Grandmother    Hypertension Paternal Grandmother      Medications: Current Outpatient Medications  Medication Sig Dispense Refill   lisdexamfetamine (VYVANSE ) 40 MG capsule Take 1 capsule (40 mg total) by mouth every morning. 30 capsule 0   lisdexamfetamine (VYVANSE ) 40 MG capsule Take 1 capsule (40 mg total) by mouth every morning. 30 capsule 0   lisdexamfetamine (VYVANSE ) 40 MG capsule Take 1 capsule (40 mg total) by mouth every morning. 30 capsule 0   loratadine (CLARITIN) 10 MG tablet Take 10 mg by mouth daily.     Melatonin 1 MG TABS Take by mouth at bedtime. Takes 2 - 3 tablets     VITAMIN D  PO Take by mouth.  No current facility-administered medications for this visit.   Subjective: Patient engaged in telehealth session via video.  Assessed progress since initial visit where he shared how his girlfriend were away on a recent business trip.  He stated that she is due back today.  Went on to share how they have had some challenges in the relationship, this causing some anxiety.  He stated he was able to talk with his girlfriend about some of his concerns where they had a meaningful discussion.  They  have been dating for about 3 years and he shared further how he has had to learn more about himself in this relationship as has not been in the relationship of this length previously.  He stated that he is currently applying to not school and another source of anxiety is the idea that their relationship may not continue due to potential distance between them at that time.  He stated they were able to have a discussion about this a few months ago which was helpful.  Ways to cope was explored where he plans to avoid dwelling on this possibility but reminding himself on occasion of the possibility and talking with his girlfriend further if needed.  He was able to identify how he does not mean to overfocus on this occurring.  He stated that he often will process issues aloud to himself to organize thoughts and to increase self understanding.   Explored his history of anxiety, panic attacks specifically where he stated he had one about 2 to 3 years ago, none since that time.  Explored ways he copes and cares for himself to manage stress, anxiety, which include working out, spending time with his girlfriend.  Interventions: Supportive therapy, motivational interviewing, CBT  Diagnoses:    ICD-10-CM   1. Attention deficit hyperactivity disorder (ADHD), combined type  F90.2       Plan: Patient is to use coping skills to help manage decrease symptoms.  Patient to utilize his support system, follow through with ways to care for himself, working out, spending time with friends and his girlfriend.    Long-term goal:   Reduce anxiety and improve coping for at least 3 consecutive months oer patient report.    Short-term goal:  Identify stressors that contribute to anxiety and work to resolve the as needed. Decrease anxiety producing self talk and become more mindful of thoughts that increase his anxiety and work to reframe as needed. Identify ways to cope and care for himself to relieve stress and anxiety.    Assessment of progress:  progressing   This record has been created using [employer].  Chart creation errors have been sought, but may not always have been located and corrected. Such creation errors do not reflect on the standard of medical care.    Avram Lenis, [facility]

## 2024-04-02 ENCOUNTER — Ambulatory Visit: Payer: Self-pay | Admitting: Family Medicine

## 2024-04-16 ENCOUNTER — Other Ambulatory Visit: Payer: Self-pay

## 2024-04-17 ENCOUNTER — Ambulatory Visit: Admitting: Family Medicine

## 2024-04-17 ENCOUNTER — Telehealth: Admitting: Family Medicine

## 2024-04-17 ENCOUNTER — Ambulatory Visit: Payer: 59 | Admitting: Family Medicine

## 2024-04-17 DIAGNOSIS — F988 Other specified behavioral and emotional disorders with onset usually occurring in childhood and adolescence: Secondary | ICD-10-CM | POA: Diagnosis not present

## 2024-04-17 MED ORDER — LISDEXAMFETAMINE DIMESYLATE 40 MG PO CAPS
40.0000 mg | ORAL_CAPSULE | ORAL | 0 refills | Status: AC
Start: 2024-04-17 — End: ?
  Filled 2024-04-17 – 2024-07-13 (×3): qty 30, 30d supply, fill #0

## 2024-04-17 MED ORDER — LISDEXAMFETAMINE DIMESYLATE 40 MG PO CAPS
40.0000 mg | ORAL_CAPSULE | ORAL | 0 refills | Status: AC
Start: 2024-04-17 — End: ?
  Filled 2024-04-17 – 2024-05-29 (×2): qty 30, 30d supply, fill #0

## 2024-04-17 MED ORDER — LISDEXAMFETAMINE DIMESYLATE 40 MG PO CAPS
40.0000 mg | ORAL_CAPSULE | Freq: Every morning | ORAL | 0 refills | Status: AC
Start: 2024-04-17 — End: ?
  Filled 2024-04-17 – 2024-09-08 (×2): qty 30, 30d supply, fill #0

## 2024-04-17 NOTE — Progress Notes (Signed)
   Subjective:    Patient ID: Matthew Ibarra, male    DOB: December 20, 2000, 23 y.o.   MRN: [id number]  HPI  Virtual Visit via Video Note  I connected with Matthew Ibarra on 04/17/24 at  2:50 PM EDT by a video enabled telemedicine application and verified that I am speaking with the correct person using two identifiers.  Location: Patient: Medical laboratory scientific officer: Office   I discussed the limitations of evaluation and management by telemedicine and the availability of in person appointments. The patient expressed understanding and agreed to proceed.  History of Present Illness:    Observations/Objective:   Assessment and Plan:   Follow Up Instructions:    I discussed the assessment and treatment plan with the patient. The patient was provided an opportunity to ask questions and all were answered. The patient agreed with the plan and demonstrated an understanding of the instructions.   The patient was advised to call back or seek an in-person evaluation if the symptoms worsen or if the condition fails to improve as anticipated.  I provided 50 pain minutes of non-face-to-face time during this encounter.   Charlotta Cook, MD  This patient has adult ADD. Takes medication responsibly. Medication does help the patient focus in be more functional. Patient relates that they are or not abusing the medication or misusing the medication. The patient understands that if they're having any negative side effects such as elevated high blood pressure severe headaches they would need stop the medication follow-up immediately. They also understand that the prescriptions are to last for 3 months then the patient will need to follow-up before having further prescriptions.  Patient compliance good compliance  Does medication help patient function /attention better it does allow him to stay more focused and attentive  Side effects denies side effects  using the medication on workdays  typically does not take it if is not working  Patient is getting counseling currently for anxiety and he feels that this is under good control  Also patient is applying for medical school This is a year-long process  Review of Systems     Objective:   Physical Exam Patient had virtual visit-video Appears to be in no distress Atraumatic Neuro able to relate and oriented No apparent resp distress Color normal        Assessment & Plan:  ADD-we will renew his medicines 3 prescription sent in He can notify us  when the fourth 1 is needed Should he have any setbacks or problems he is to let us  know He is doing well with the medicine Compliant well  We did review his lab work today in detail he is taking vitamin D  We will recheck lab work again in 1 years time Follow-up 4 months this may be video if he is unable to come in due to his work  The patient was seen today as part of the visit regarding ADD.  Patient is stable on current regimen.  Appropriate prescriptions prescribed.  Medications were reviewed with the patient as well as compliance. Side effects were checked for. Discussion regarding effectiveness was held. Prescriptions were electronically sent in.  Patient reminded to follow-up in approximately 3 months.   Plans to [place]  law with drug registry was checked and verified while present with the patient.

## 2024-04-18 ENCOUNTER — Other Ambulatory Visit: Payer: Self-pay

## 2024-04-19 ENCOUNTER — Ambulatory Visit: Admitting: Mental Health

## 2024-04-19 DIAGNOSIS — F902 Attention-deficit hyperactivity disorder, combined type: Secondary | ICD-10-CM | POA: Diagnosis not present

## 2024-04-19 NOTE — Progress Notes (Addendum)
 [facility] psychotherapy note  Name: Matthew Ibarra Date: 04/19/24 MRN: [id number] DOB: [birth year] PCP: Alphonsa Glendia LABOR, MD  Time spent: 48 minutes  Treatment:   ind. Therapy  Virtual Visit via Telehealth Note Connected with patient by a telemedicine/telehealth application, with their informed consent, and verified patient privacy and that I am speaking with the correct person using two identifiers. I discussed the limitations, risks, security and privacy concerns of performing psychotherapy and the availability of in person appointments. I also discussed with the patient that there may be a patient responsible charge related to this service. The patient expressed understanding and agreed to proceed. I discussed the treatment planning with the patient. The patient was provided an opportunity to ask questions and all were answered. The patient agreed with the plan and demonstrated an understanding of the instructions. The patient was advised to call  our office if  symptoms worsen or feel they are in a crisis state and need immediate contact.   Therapist Location: office Patient Location: home    Mental Status Exam:    Appearance:    Casual     Behavior:   Appropriate  Motor:   WNL  Speech/Language:    Clear and Coherent  Affect:   Full range   Mood:   Euthymic  Thought process:   Logical, linear, goal directed  Thought content:     WNL  Sensory/Perceptual disturbances:     none  Orientation:   x4  Attention:   Good  Concentration:   Good  Memory:   Intact  Fund of knowledge:    Consistent with age and development  Insight:     Good  Judgment:    Good  Impulse Control:   Good     Reported Symptoms:  anxiety, depressed mood, over thinking at times, some sleep disturbance at times, challenges with focus and attention, distractible(currently managed with Vyvanse )   Risk Assessment: Danger to Self:  No Self-injurious Behavior: No Danger to Others: No Duty to  Warn:no Physical Aggression / Violence:No  Access to Firearms a concern: No  Gang Involvement:No  Patient / guardian was educated about steps to take if suicide or homicide risk level increases between visits: yes While future psychiatric events cannot be accurately predicted, the patient does not currently require acute inpatient psychiatric care and does not currently meet [place]  involuntary commitment criteria.    Family History  Problem Relation Age of Onset   ADD / ADHD Mother    Hypertension Mother    Hypertension Father    Down syndrome Sister    Hypertension Maternal Grandmother    Hyperlipidemia Maternal Grandmother    Hypertension Maternal Grandfather    Hyperlipidemia Maternal Grandfather    Prostate cancer Maternal Grandfather    Hyperlipidemia Paternal Grandmother    Hypertension Paternal Grandmother      Medications: Current Outpatient Medications  Medication Sig Dispense Refill   lisdexamfetamine (VYVANSE ) 40 MG capsule Take 1 capsule (40 mg total) by mouth every morning. 30 capsule 0   lisdexamfetamine (VYVANSE ) 40 MG capsule Take 1 capsule (40 mg total) by mouth every morning. 30 capsule 0   lisdexamfetamine (VYVANSE ) 40 MG capsule Take 1 capsule (40 mg total) by mouth every morning. 30 capsule 0   loratadine (CLARITIN) 10 MG tablet Take 10 mg by mouth daily.     Melatonin 1 MG TABS Take by mouth at bedtime. Takes 2 - 3 tablets     VITAMIN D  PO Take by mouth.  No current facility-administered medications for this visit.   Subjective: Patient engaged in telehealth session via video.   Patient shared how he wants to discontinue his THC use. He went on to share how he's quit before, currently however he wants to prove to himself that he's able to stop using. He said he does not smoke daily but a few days per week. Collaboratively, explored his motivation for change, ways to be effective and discontinuing use. He stated that he has tried to engage in  interest and plans to do so as a way to help distract himself particularly in the evenings when his cravings are most notable. Facilitated has identifying ways to frame his thoughts around motivating phrases to remind himself of come encouraged listing these out as a way to motivate himself. He stated that he uses a whiteboard in his house for his daily schedule and plans to utilize as a message board to remind himself of his goal and motivations.   Interventions: Supportive therapy, motivational interviewing, CBT  Diagnoses:    ICD-10-CM   1. Attention deficit hyperactivity disorder (ADHD), combined type  F90.2        Plan: Patient is to use coping skills to help manage decrease symptoms.  Patient to utilize his support system, follow through with ways to care for himself, working out, spending time with friends and his girlfriend.    Long-term goal:   Reduce anxiety and improve coping for at least 3 consecutive months oer patient report.    Short-term goal:  Identify stressors that contribute to anxiety and work to resolve the as needed. Decrease anxiety producing self talk and become more mindful of thoughts that increase his anxiety and work to reframe as needed. Identify ways to cope and care for himself to relieve stress and anxiety.   Assessment of progress:  progressing   This record has been created using [employer].  Chart creation errors have been sought, but may not always have been located and corrected. Such creation errors do not reflect on the standard of medical care.    Lonni Fischer, [facility]

## 2024-05-11 ENCOUNTER — Ambulatory Visit: Admitting: Mental Health

## 2024-05-11 DIAGNOSIS — F902 Attention-deficit hyperactivity disorder, combined type: Secondary | ICD-10-CM | POA: Diagnosis not present

## 2024-05-11 NOTE — Progress Notes (Addendum)
 [facility] psychotherapy note  Name: Matthew Ibarra Date: 05/11/24 MRN: [id number] DOB: 13-May-2001 PCP: Alphonsa Glendia LABOR, MD  Time spent: 48 minutes  Treatment:   ind. Therapy  Virtual Visit via Telehealth Note Connected with patient by a telemedicine/telehealth application, with their informed consent, and verified patient privacy and that I am speaking with the correct person using two identifiers. I discussed the limitations, risks, security and privacy concerns of performing psychotherapy and the availability of in person appointments. I also discussed with the patient that there may be a patient responsible charge related to this service. The patient expressed understanding and agreed to proceed. I discussed the treatment planning with the patient. The patient was provided an opportunity to ask questions and all were answered. The patient agreed with the plan and demonstrated an understanding of the instructions. The patient was advised to call  our office if  symptoms worsen or feel they are in a crisis state and need immediate contact.   Therapist Location: office Patient Location: home    Mental Status Exam:    Appearance:    Casual     Behavior:   Appropriate  Motor:   WNL  Speech/Language:    Clear and Coherent  Affect:   Full range   Mood:   Euthymic  Thought process:   Logical, linear, goal directed  Thought content:     WNL  Sensory/Perceptual disturbances:     none  Orientation:   x4  Attention:   Good  Concentration:   Good  Memory:   Intact  Fund of knowledge:    Consistent with age and development  Insight:     Good  Judgment:    Good  Impulse Control:   Good     Reported Symptoms:  anxiety, depressed mood, over thinking at times, some sleep disturbance at times, challenges with focus and attention, distractible(currently managed with Vyvanse )   Risk Assessment: Danger to Self:  No Self-injurious Behavior: No Danger to Others: No Duty to  Warn:no Physical Aggression / Violence:No  Access to Firearms a concern: No  Gang Involvement:No  Patient / guardian was educated about steps to take if suicide or homicide risk level increases between visits: yes While future psychiatric events cannot be accurately predicted, the patient does not currently require acute inpatient psychiatric care and does not currently meet [place]  involuntary commitment criteria.    Family History  Problem Relation Age of Onset   ADD / ADHD Mother    Hypertension Mother    Hypertension Father    Down syndrome Sister    Hypertension Maternal Grandmother    Hyperlipidemia Maternal Grandmother    Hypertension Maternal Grandfather    Hyperlipidemia Maternal Grandfather    Prostate cancer Maternal Grandfather    Hyperlipidemia Paternal Grandmother    Hypertension Paternal Grandmother      Medications: Current Outpatient Medications  Medication Sig Dispense Refill   lisdexamfetamine (VYVANSE ) 40 MG capsule Take 1 capsule (40 mg total) by mouth every morning. 30 capsule 0   lisdexamfetamine (VYVANSE ) 40 MG capsule Take 1 capsule (40 mg total) by mouth every morning. 30 capsule 0   lisdexamfetamine (VYVANSE ) 40 MG capsule Take 1 capsule (40 mg total) by mouth every morning. 30 capsule 0   loratadine (CLARITIN) 10 MG tablet Take 10 mg by mouth daily.     Melatonin 1 MG TABS Take by mouth at bedtime. Takes 2 - 3 tablets     VITAMIN D  PO Take by mouth.  No current facility-administered medications for this visit.   Subjective: Patient engaged in telehealth session via video.   He shared progress, how he has been successful in abstaining from cannabis use over the past 3 weeks.  He stated that he looks forward for vacation which starts tomorrow.  He went on to share how he and his family plan to go on vacation, along with his girlfriend and best friend.  He shared further how he was concerned about his sister, who recently needed hospitalization  for about 2 days due to developing pneumonia.  He stated that he received a call from his mother, he felt distressed, tearful.  He made the connection of the feelings he experienced related to also the loss of his father going through the challenges at that time.  Provide support and understanding well facilitating his sharing thoughts and feelings related, specifically what he feels he is coming to understand more about himself in the process.  He went on to share differences between his parents, how he needs them both but particularly Mrs. His father as he stated they were close relationally and his father being able to provide emotional support and security.  Interventions: Supportive therapy, motivational interviewing, CBT  Diagnoses:    ICD-10-CM   1. Attention deficit hyperactivity disorder (ADHD), combined type  F90.2         Plan: Patient is to use coping skills to help manage decrease symptoms.  Patient to utilize his support system, follow through with ways to care for himself, working out, spending time with friends and his girlfriend.    Long-term goal:   Reduce anxiety and improve coping for at least 3 consecutive months oer patient report.    Short-term goal:  Identify stressors that contribute to anxiety and work to resolve the as needed. Decrease anxiety producing self talk and become more mindful of thoughts that increase his anxiety and work to reframe as needed. Identify ways to cope and care for himself to relieve stress and anxiety.   Assessment of progress:  progressing   This record has been created using [employer].  Chart creation errors have been sought, but may not always have been located and corrected. Such creation errors do not reflect on the standard of medical care.    Lonni Fischer, [facility]

## 2024-05-23 ENCOUNTER — Ambulatory Visit (INDEPENDENT_AMBULATORY_CARE_PROVIDER_SITE_OTHER): Admitting: Mental Health

## 2024-05-23 DIAGNOSIS — F902 Attention-deficit hyperactivity disorder, combined type: Secondary | ICD-10-CM | POA: Diagnosis not present

## 2024-05-23 NOTE — Progress Notes (Signed)
 [facility] psychotherapy note  Name: Matthew Ibarra Date: 05/23/24 MRN: [id number] DOB: [birth year] PCP: Alphonsa Glendia LABOR, MD  Time spent: 47 minutes  Treatment:   ind. Therapy  Virtual Visit via Telehealth Note Connected with patient by a telemedicine/telehealth application, with their informed consent, and verified patient privacy and that I am speaking with the correct person using two identifiers. I discussed the limitations, risks, security and privacy concerns of performing psychotherapy and the availability of in person appointments. I also discussed with the patient that there may be a patient responsible charge related to this service. The patient expressed understanding and agreed to proceed. I discussed the treatment planning with the patient. The patient was provided an opportunity to ask questions and all were answered. The patient agreed with the plan and demonstrated an understanding of the instructions. The patient was advised to call  our office if  symptoms worsen or feel they are in a crisis state and need immediate contact.   Therapist Location: office Patient Location: home    Mental Status Exam:    Appearance:    Casual     Behavior:   Appropriate  Motor:   WNL  Speech/Language:    Clear and Coherent  Affect:   Full range   Mood:   Euthymic  Thought process:   Logical, linear, goal directed  Thought content:     WNL  Sensory/Perceptual disturbances:     none  Orientation:   x4  Attention:   Good  Concentration:   Good  Memory:   Intact  Fund of knowledge:    Consistent with age and development  Insight:     Good  Judgment:    Good  Impulse Control:   Good     Reported Symptoms:  anxiety, depressed mood, over thinking at times, some sleep disturbance at times, challenges with focus and attention, distractible(currently managed with Vyvanse )   Risk Assessment: Danger to Self:  No Self-injurious Behavior: No Danger to Others: No Duty to  Warn:no Physical Aggression / Violence:No  Access to Firearms a concern: No  Gang Involvement:No  Patient / guardian was educated about steps to take if suicide or homicide risk level increases between visits: yes While future psychiatric events cannot be accurately predicted, the patient does not currently require acute inpatient psychiatric care and does not currently meet [place]  involuntary commitment criteria.     Medications: Current Outpatient Medications  Medication Sig Dispense Refill   lisdexamfetamine (VYVANSE ) 40 MG capsule Take 1 capsule (40 mg total) by mouth every morning. 30 capsule 0   lisdexamfetamine (VYVANSE ) 40 MG capsule Take 1 capsule (40 mg total) by mouth every morning. 30 capsule 0   lisdexamfetamine (VYVANSE ) 40 MG capsule Take 1 capsule (40 mg total) by mouth every morning. 30 capsule 0   loratadine (CLARITIN) 10 MG tablet Take 10 mg by mouth daily.     Melatonin 1 MG TABS Take by mouth at bedtime. Takes 2 - 3 tablets     VITAMIN D  PO Take by mouth.     No current facility-administered medications for this visit.   Subjective: Patient engaged in telehealth session via video.   Patient shared details related to his recent vacation with family.  Less of the session was centered around the events that took place on this vacation as there was some distressful exchanges between he and his mother.  He went on to share details related, his mother getting upset and different instances going on  vacation, most notably at the beginning at the airport and then once at the vacation home.  Patient to process feelings related, time was provided for him to process feelings of confusion, sadness and frustration regarding some of his mother's behaviors.  Attempts he made at the time and thereafter to communicate with her or also shared, the latter being more effective due to his mother being more calm and focused.  He process feelings related also to the relationship he had  with his father.  At this point, he is to go on a vacation with his mother next month, which is the 2 of them as this recent medication included his girlfriend and friend.  After consideration he plans on going on a trip with his mother in the hopes that they can work on their relationship.  interventions: Supportive therapy, motivational interviewing, CBT  Diagnoses:    ICD-10-CM   1. Attention deficit hyperactivity disorder (ADHD), combined type  F90.2          Plan: Patient is to use coping skills to help manage decrease symptoms.  Patient to utilize his support system, follow through with ways to care for himself, working out, spending time with friends and his girlfriend.    Long-term goal:   Reduce anxiety and improve coping for at least 3 consecutive months oer patient report.    Short-term goal:  Identify stressors that contribute to anxiety and work to resolve the as needed. Decrease anxiety producing self talk and become more mindful of thoughts that increase his anxiety and work to reframe as needed. Identify ways to cope and care for himself to relieve stress and anxiety.   Assessment of progress:  progressing   This record has been created using [employer].  Chart creation errors have been sought, but may not always have been located and corrected. Such creation errors do not reflect on the standard of medical care.    Lonni Fischer, [facility]

## 2024-05-29 ENCOUNTER — Other Ambulatory Visit: Payer: Self-pay

## 2024-06-08 ENCOUNTER — Ambulatory Visit: Admitting: Mental Health

## 2024-06-08 DIAGNOSIS — F419 Anxiety disorder, unspecified: Secondary | ICD-10-CM | POA: Diagnosis not present

## 2024-06-08 DIAGNOSIS — F902 Attention-deficit hyperactivity disorder, combined type: Secondary | ICD-10-CM

## 2024-06-08 NOTE — Progress Notes (Signed)
 [facility] psychotherapy note  Name: Matthew Ibarra Date: 05/23/24 MRN: [id number] DOB: [birth year] PCP: Alphonsa Glendia LABOR, MD  Time spent: 33 minutes  Treatment:   ind. Therapy  Virtual Visit via Telehealth Note Connected with patient by a telemedicine/telehealth application, with their informed consent, and verified patient privacy and that I am speaking with the correct person using two identifiers. I discussed the limitations, risks, security and privacy concerns of performing psychotherapy and the availability of in person appointments. I also discussed with the patient that there may be a patient responsible charge related to this service. The patient expressed understanding and agreed to proceed. I discussed the treatment planning with the patient. The patient was provided an opportunity to ask questions and all were answered. The patient agreed with the plan and demonstrated an understanding of the instructions. The patient was advised to call  our office if  symptoms worsen or feel they are in a crisis state and need immediate contact.   Therapist Location: office Patient Location: home    Mental Status Exam:    Appearance:    Casual     Behavior:   Appropriate  Motor:   WNL  Speech/Language:    Clear and Coherent  Affect:   Full range   Mood:   Euthymic  Thought process:   Logical, linear, goal directed  Thought content:     WNL  Sensory/Perceptual disturbances:     none  Orientation:   x4  Attention:   Good  Concentration:   Good  Memory:   Intact  Fund of knowledge:    Consistent with age and development  Insight:     Good  Judgment:    Good  Impulse Control:   Good     Reported Symptoms:  anxiety, depressed mood, over thinking at times, some sleep disturbance at times, challenges with focus and attention, distractible(currently managed with Vyvanse )   Risk Assessment: Danger to Self:  No Self-injurious Behavior: No Danger to Others: No Duty to  Warn:no Physical Aggression / Violence:No  Access to Firearms a concern: No  Gang Involvement:No  Patient / guardian was educated about steps to take if suicide or homicide risk level increases between visits: yes While future psychiatric events cannot be accurately predicted, the patient does not currently require acute inpatient psychiatric care and does not currently meet [place]  involuntary commitment criteria.     Medications: Current Outpatient Medications  Medication Sig Dispense Refill   lisdexamfetamine (VYVANSE ) 40 MG capsule Take 1 capsule (40 mg total) by mouth every morning. 30 capsule 0   lisdexamfetamine (VYVANSE ) 40 MG capsule Take 1 capsule (40 mg total) by mouth every morning. 30 capsule 0   lisdexamfetamine (VYVANSE ) 40 MG capsule Take 1 capsule (40 mg total) by mouth every morning. 30 capsule 0   loratadine (CLARITIN) 10 MG tablet Take 10 mg by mouth daily.     Melatonin 1 MG TABS Take by mouth at bedtime. Takes 2 - 3 tablets     VITAMIN D  PO Take by mouth.     No current facility-administered medications for this visit.   Subjective: Patient engaged in telehealth session via video.  He shared recent events where he stated that college course work, enjoyed going on a trip to [place] with his lab partners.  He stated that he was able to spend time with friends last night but identified needing some relaxation time just to himself and looks forward to trying to focus on that  over this weekend.  He stated that his father's birthday and anniversary of his passing is later this month.  His mother and sister ran like house for the weekend where he plans to spend Sunday with him as opposed to the whole weekend, began to further allow himself to have his own time to reflect.  He shared how the relationship with his mother has improved somewhat, he feels they have managed past the issue that happened on their vacation where she got upset.  He stated that he would like her  to be accountable as opposed to her trying to justify her actions at that time however, he stated that he is not going to speak this out from her but would be open to it if she engages him in a discussion.  He shared how he has started his school interviews.  He stated that forgot to apply for secondary school recently, this causing some anxiety and stress.  Provide support and encouragement for him to recognize how he is giving himself many other options, other schools and also finding some self-acceptance given the situation.   interventions: Supportive therapy, motivational interviewing, CBT  Diagnoses:  No diagnosis found.   Plan: Patient is to use coping skills to help manage decrease symptoms.  Patient to utilize his support system, follow through with ways to care for himself, working out, spending time with friends and his girlfriend.    Long-term goal:   Reduce anxiety and improve coping for at least 3 consecutive months oer patient report.    Short-term goal:  Identify stressors that contribute to anxiety and work to resolve the as needed. Decrease anxiety producing self talk and become more mindful of thoughts that increase his anxiety and work to reframe as needed. Identify ways to cope and care for himself to relieve stress and anxiety.   Assessment of progress:  progressing   This record has been created using [employer].  Chart creation errors have been sought, but may not always have been located and corrected. Such creation errors do not reflect on the standard of medical care.    Lonni Fischer, [facility]

## 2024-07-05 ENCOUNTER — Telehealth: Payer: Self-pay | Admitting: Family Medicine

## 2024-07-05 NOTE — Telephone Encounter (Signed)
 Nurses Mother sent a message regarding this patient needing a test Please see highlighted area below Go ahead and refer due to family history of cardiomyopathy  Good afternoon,          [facility] cardiology wants [name] and Tannor to have EPS studies performed. I asked if they were OK for this to be performed at [facility] rather than [facility] secondary to insurance issues we have had.        They stated it should be fine. Their EPS provider recommended Dr. Ole Holts at [facility] to perform the study.        Are you comfortable making this referral or would you prefer the cardiologist do so?   Thank you, [name]

## 2024-07-05 NOTE — Telephone Encounter (Signed)
 May have referral to Dr. Ole Holts as requested in the message

## 2024-07-06 ENCOUNTER — Other Ambulatory Visit: Payer: Self-pay

## 2024-07-06 DIAGNOSIS — Z8249 Family history of ischemic heart disease and other diseases of the circulatory system: Secondary | ICD-10-CM

## 2024-07-10 ENCOUNTER — Other Ambulatory Visit: Payer: Self-pay

## 2024-07-13 ENCOUNTER — Other Ambulatory Visit: Payer: Self-pay

## 2024-07-13 ENCOUNTER — Other Ambulatory Visit (HOSPITAL_COMMUNITY): Payer: Self-pay

## 2024-08-07 ENCOUNTER — Ambulatory Visit: Admitting: Mental Health

## 2024-08-07 DIAGNOSIS — F902 Attention-deficit hyperactivity disorder, combined type: Secondary | ICD-10-CM | POA: Diagnosis not present

## 2024-08-07 NOTE — Progress Notes (Signed)
 [facility] psychotherapy note  Name: SHOOTER TANGEN Date: 08/07/2024 MRN: [id number] DOB: 08/07/2001 PCP: Alphonsa Glendia LABOR, MD  Time spent: 51 minutes  Treatment:   ind. Therapy  Virtual Visit via Telehealth Note Connected with patient by a telemedicine/telehealth application, with their informed consent, and verified patient privacy and that I am speaking with the correct person using two identifiers. I discussed the limitations, risks, security and privacy concerns of performing psychotherapy and the availability of in person appointments. I also discussed with the patient that there may be a patient responsible charge related to this service. The patient expressed understanding and agreed to proceed. I discussed the treatment planning with the patient. The patient was provided an opportunity to ask questions and all were answered. The patient agreed with the plan and demonstrated an understanding of the instructions. The patient was advised to call  our office if  symptoms worsen or feel they are in a crisis state and need immediate contact.   Therapist Location: office Patient Location: home    Mental Status Exam:    Appearance:    Casual     Behavior:   Appropriate  Motor:   WNL  Speech/Language:    Clear and Coherent  Affect:   Full range   Mood:   Euthymic  Thought process:   Logical, linear, goal directed  Thought content:     WNL  Sensory/Perceptual disturbances:     none  Orientation:   x4  Attention:   Good  Concentration:   Good  Memory:   Intact  Fund of knowledge:    Consistent with age and development  Insight:     Good  Judgment:    Good  Impulse Control:   Good     Reported Symptoms:  anxiety, depressed mood, over thinking at times, some sleep disturbance at times, challenges with focus and attention, distractible(currently managed with Vyvanse )   Risk Assessment: Danger to Self:  No Self-injurious Behavior: No Danger to Others: No Duty to  Warn:no Physical Aggression / Violence:No  Access to Firearms a concern: No  Gang Involvement:No  Patient / guardian was educated about steps to take if suicide or homicide risk level increases between visits: yes While future psychiatric events cannot be accurately predicted, the patient does not currently require acute inpatient psychiatric care and does not currently meet [place]  involuntary commitment criteria.     Medications: Current Outpatient Medications  Medication Sig Dispense Refill   lisdexamfetamine (VYVANSE ) 40 MG capsule Take 1 capsule (40 mg total) by mouth every morning. 30 capsule 0   lisdexamfetamine (VYVANSE ) 40 MG capsule Take 1 capsule (40 mg total) by mouth every morning. 30 capsule 0   lisdexamfetamine (VYVANSE ) 40 MG capsule Take 1 capsule (40 mg total) by mouth every morning. 30 capsule 0   loratadine (CLARITIN) 10 MG tablet Take 10 mg by mouth daily.     Melatonin 1 MG TABS Take by mouth at bedtime. Takes 2 - 3 tablets     VITAMIN D  PO Take by mouth.     No current facility-administered medications for this visit.   Subjective: Patient engaged in telehealth session via video.  Assessed progress for patient shared how he is dating to make some changes.  He stated that he wants to discontinue his cannabis use as he has been using since he got back from his last vacation which was about 2 months ago.  Going on to share, he also wants to be more consistent  with his gym routine, going a few days per week.  He stated that he has difficulty with some anxiety and also feels depressed about 2 to 3 days/week.  He denies it being severe, denies any SI.  He states he has felt this way for the past year or 2, potentially related to the passing of his father where he was present at that time.  This was further explored collaboratively, the relationship at that time as well as experiences in childhood and adolescence.  He shared further how he feels that he was not allowed to  identify and share his feelings which he feels explains why he might have a tendency to suppress feelings do not communicate; he stated that he has made his girlfriend aware of this tendency.  He stated that his sister, who copes with autism, is often a focus of his parents, which he understands.  Ways to cope and care for himself by utilizing diaphragmatic breathing was discussed and encouraged.  Also discussed some mindfulness concepts to be utilized to improve his ability to relax as he stated this is a difficulty.   interventions: Supportive therapy, motivational interviewing, CBT  Diagnoses:    ICD-10-CM   1. Attention deficit hyperactivity disorder (ADHD), combined type  F90.2        Plan: Patient is to use coping skills to help manage decrease symptoms.  Patient to utilize his support system, follow through with ways to care for himself, working out, spending time with friends and his girlfriend.    Long-term goal:   Reduce anxiety and improve coping for at least 3 consecutive months oer patient report.    Short-term goal:  Identify stressors that contribute to anxiety and work to resolve the as needed. Decrease anxiety producing self talk and become more mindful of thoughts that increase his anxiety and work to reframe as needed. Identify ways to cope and care for himself to relieve stress and anxiety.   Assessment of progress:  progressing   This record has been created using [employer].  Chart creation errors have been sought, but may not always have been located and corrected. Such creation errors do not reflect on the standard of medical care.    Lonni Fischer, [facility]

## 2024-08-23 ENCOUNTER — Ambulatory Visit: Admitting: Mental Health

## 2024-08-23 DIAGNOSIS — F902 Attention-deficit hyperactivity disorder, combined type: Secondary | ICD-10-CM | POA: Diagnosis not present

## 2024-08-23 NOTE — Progress Notes (Signed)
 [facility] psychotherapy note  Name: ZACHERY NISWANDER Date: 08/23/2024 MRN: [id number] DOB: 2001/11/02 PCP: Alphonsa Glendia LABOR, MD  Time spent: 25 minutes  Treatment:   ind. Therapy  Virtual Visit via Telehealth Note Connected with patient by a telemedicine/telehealth application, with their informed consent, and verified patient privacy and that I am speaking with the correct person using two identifiers. I discussed the limitations, risks, security and privacy concerns of performing psychotherapy and the availability of in person appointments. I also discussed with the patient that there may be a patient responsible charge related to this service. The patient expressed understanding and agreed to proceed. I discussed the treatment planning with the patient. The patient was provided an opportunity to ask questions and all were answered. The patient agreed with the plan and demonstrated an understanding of the instructions. The patient was advised to call  our office if  symptoms worsen or feel they are in a crisis state and need immediate contact.   Therapist Location: office Patient Location: home    Mental Status Exam:    Appearance:    Casual     Behavior:   Appropriate  Motor:   WNL  Speech/Language:    Clear and Coherent  Affect:   Full range   Mood:   Euthymic  Thought process:   Logical, linear, goal directed  Thought content:     WNL  Sensory/Perceptual disturbances:     none  Orientation:   x4  Attention:   Good  Concentration:   Good  Memory:   Intact  Fund of knowledge:    Consistent with age and development  Insight:     Good  Judgment:    Good  Impulse Control:   Good     Reported Symptoms:  anxiety, depressed mood, over thinking at times, some sleep disturbance at times, challenges with focus and attention, distractible(currently managed with Vyvanse )   Risk Assessment: Danger to Self:  No Self-injurious Behavior: No Danger to Others: No Duty to  Warn:no Physical Aggression / Violence:No  Access to Firearms a concern: No  Gang Involvement:No  Patient / guardian was educated about steps to take if suicide or homicide risk level increases between visits: yes While future psychiatric events cannot be accurately predicted, the patient does not currently require acute inpatient psychiatric care and does not currently meet [place]  involuntary commitment criteria.     Medications: Current Outpatient Medications  Medication Sig Dispense Refill   lisdexamfetamine (VYVANSE ) 40 MG capsule Take 1 capsule (40 mg total) by mouth every morning. 30 capsule 0   lisdexamfetamine (VYVANSE ) 40 MG capsule Take 1 capsule (40 mg total) by mouth every morning. 30 capsule 0   lisdexamfetamine (VYVANSE ) 40 MG capsule Take 1 capsule (40 mg total) by mouth every morning. 30 capsule 0   loratadine (CLARITIN) 10 MG tablet Take 10 mg by mouth daily.     Melatonin 1 MG TABS Take by mouth at bedtime. Takes 2 - 3 tablets     VITAMIN D  PO Take by mouth.     No current facility-administered medications for this visit.   Subjective: Patient engaged in telehealth session via video.  Assessed progress as patient shared he is very busy week.  He stated that he got back from vacation with his mother as well as [place] on Monday.  He stated that they had a good time, were able to enjoy each other's company, had minimal arguments which please him.  He stated that it  took a day or 2 for them to be around each other and feel comfortable but after that they had a very pleasant more relaxed time.  He went on to share how he did not use cannabis over that week, that he really did not think about it.  He states since that time he has used since he has been home.  Facilitated his further identifying what he has learned about himself as he continues to state his eventual goal rating.  He stated that he feels that he has some ability to stop, this recently illustrated which is  giving him more confidence when the time comes.  Reports less days of depression due to being busy over the last week or so.  And a way to cope and care for himself it was decided to take the weekend off from work to have a 3-day weekend.  He plans on getting rest and spending some time with his friends and girlfriend.   interventions: Supportive therapy, motivational interviewing, CBT  Diagnoses:    ICD-10-CM   1. Attention deficit hyperactivity disorder (ADHD), combined type  F90.2         Plan: Patient is to use coping skills to help manage decrease symptoms.  Patient to utilize his support system, follow through with ways to care for himself, working out, spending time with friends and his girlfriend.    Long-term goal:   Reduce anxiety and improve coping for at least 3 consecutive months oer patient report.    Short-term goal:  Identify stressors that contribute to anxiety and work to resolve the as needed. Decrease anxiety producing self talk and become more mindful of thoughts that increase his anxiety and work to reframe as needed. Identify ways to cope and care for himself to relieve stress and anxiety.   Assessment of progress:  progressing   This record has been created using [employer].  Chart creation errors have been sought, but may not always have been located and corrected. Such creation errors do not reflect on the standard of medical care.    Lonni Fischer, [facility]

## 2024-09-03 ENCOUNTER — Ambulatory Visit: Admitting: Mental Health

## 2024-09-03 DIAGNOSIS — F902 Attention-deficit hyperactivity disorder, combined type: Secondary | ICD-10-CM | POA: Diagnosis not present

## 2024-09-03 NOTE — Progress Notes (Signed)
 [facility] psychotherapy [facility]  Name: Matthew Ibarra Date: 09/03/2024 MRN: [id number] DOB: April 30, 2001 PCP: Alphonsa Glendia LABOR, MD  Time spent: 45 minutes  Treatment:   ind. Therapy  Virtual Visit via Telehealth Note Connected with patient by a telemedicine/telehealth application, with their informed consent, and verified patient privacy and that I am speaking with the correct person using two identifiers. I discussed the limitations, risks, security and privacy concerns of performing psychotherapy and the availability of in person appointments. I also discussed with the patient that there may be a patient responsible charge related to this service. The patient expressed understanding and agreed to proceed. I discussed the treatment planning with the patient. The patient was provided an opportunity to ask questions and all were answered. The patient agreed with the plan and demonstrated an understanding of the instructions. The patient was advised to call  our office if  symptoms worsen or feel they are in a crisis state and need immediate contact.   Therapist Location: office Patient Location: home    Mental Status Exam:    Appearance:    Casual     Behavior:   Appropriate  Motor:   WNL  Speech/Language:    Clear and Coherent  Affect:   Full range   Mood:   Euthymic  Thought process:   Logical, linear, goal directed  Thought content:     WNL  Sensory/Perceptual disturbances:     none  Orientation:   x4  Attention:   Good  Concentration:   Good  Memory:   Intact  Fund of knowledge:    Consistent with age and development  Insight:     Good  Judgment:    Good  Impulse Control:   Good     Reported Symptoms:  anxiety, depressed mood, over thinking at times, some sleep disturbance at times, challenges with focus and attention, distractible(currently managed with Vyvanse )   Risk Assessment: Danger to Self:  No Self-injurious Behavior: No Danger to Others: No Duty to  Warn:no Physical Aggression / Violence:No  Access to Firearms a concern: No  Gang Involvement:No  Patient / guardian was educated about steps to take if suicide or homicide risk level increases between visits: yes While future psychiatric events cannot be accurately predicted, the patient does not currently require acute inpatient psychiatric care and does not currently meet [place]  involuntary commitment criteria.     Medications: Current Outpatient Medications  Medication Sig Dispense Refill   lisdexamfetamine (VYVANSE ) 40 MG capsule Take 1 capsule (40 mg total) by mouth every morning. 30 capsule 0   lisdexamfetamine (VYVANSE ) 40 MG capsule Take 1 capsule (40 mg total) by mouth every morning. 30 capsule 0   lisdexamfetamine (VYVANSE ) 40 MG capsule Take 1 capsule (40 mg total) by mouth every morning. 30 capsule 0   loratadine (CLARITIN) 10 MG tablet Take 10 mg by mouth daily.     Melatonin 1 MG TABS Take by mouth at bedtime. Takes 2 - 3 tablets     VITAMIN D  PO Take by mouth.     No current facility-administered medications for this visit.   Subjective: Patient engaged in telehealth session via video.  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  He stated that at [facility] [employer] will probably take either December or March of next year before he hears back.  He stated that he has some associated anxiety, finds himself  trying to avoid thinking about if he gets excepted as this is his primary school choice.  Explored how he has tried to utilize thought blocking and discussed some additional ways to reframe thoughts collaboratively.  Stated that he also continues to use cannabis but has some conflictual feelings about his continued use.  He stated that he uses it as an outlet, something to look forward to when he gets down with the day.  He stated on weekends sometimes his use will increase, leaving him  less motivated and more apt to want to lay around for most of the day.  He described himself as a person who is typically driven, has brought with purpose and achieving goal such as his academics and feels that since he is not in school right now when he is just working, how it is more difficult for him to get the sense of fulfillment.  Explored other outlets, or activities with which he can engage hobbies etc. he plans to continue to review and trial a few ideas as he feels this will help offset his tendency to use.     interventions: Supportive therapy, motivational interviewing, CBT  Diagnoses:    ICD-10-CM   1. Attention deficit hyperactivity disorder (ADHD), combined type  F90.2        Plan: Patient is to use coping skills to help manage decrease symptoms.  Patient to utilize his support system, follow through with ways to care for himself, working out, spending time with friends and his girlfriend.    Long-term goal:   Reduce anxiety and improve coping for at least 3 consecutive months oer patient report.    Short-term goal:  Identify stressors that contribute to anxiety and work to resolve the as needed. Decrease anxiety producing self talk and become more mindful of thoughts that increase his anxiety and work to reframe as needed. Identify ways to cope and care for himself to relieve stress and anxiety.   Assessment of progress:  progressing   Lonni Fischer, [facility]

## 2024-09-08 ENCOUNTER — Other Ambulatory Visit: Payer: Self-pay

## 2024-10-03 ENCOUNTER — Ambulatory Visit: Admitting: Mental Health

## 2024-10-03 DIAGNOSIS — F902 Attention-deficit hyperactivity disorder, combined type: Secondary | ICD-10-CM | POA: Diagnosis not present

## 2024-10-03 NOTE — Progress Notes (Signed)
 [facility] psychotherapy note  Name: Matthew Ibarra Date: 10/03/2024 MRN: [id number] DOB: 2001-10-13 PCP: Alphonsa Glendia LABOR, MD  Time spent: 44 minutes  Treatment:   ind. Therapy  Virtual Visit via Telehealth Note Connected with patient by a telemedicine/telehealth application, with their informed consent, and verified patient privacy and that I am speaking with the correct person using two identifiers. I discussed the limitations, risks, security and privacy concerns of performing psychotherapy and the availability of in person appointments. I also discussed with the patient that there may be a patient responsible charge related to this service. The patient expressed understanding and agreed to proceed. I discussed the treatment planning with the patient. The patient was provided an opportunity to ask questions and all were answered. The patient agreed with the plan and demonstrated an understanding of the instructions. The patient was advised to call  our office if  symptoms worsen or feel they are in a crisis state and need immediate contact.   Therapist Location: office Patient Location: home    Mental Status Exam:    Appearance:    Casual     Behavior:   Appropriate  Motor:   WNL  Speech/Language:    Clear and Coherent  Affect:   Full range   Mood:   Euthymic  Thought process:   Logical, linear, goal directed  Thought content:     WNL  Sensory/Perceptual disturbances:     none  Orientation:   x4  Attention:   Good  Concentration:   Good  Memory:   Intact  Fund of knowledge:    Consistent with age and development  Insight:     Good  Judgment:    Good  Impulse Control:   Good     Reported Symptoms:  anxiety, depressed mood, over thinking at times, some sleep disturbance at times, challenges with focus and attention, distractible(currently managed with Vyvanse )   Risk Assessment: Danger to Self:  No Self-injurious Behavior: No Danger to Others: No Duty to  Warn:no Physical Aggression / Violence:No  Access to Firearms a concern: No  Gang Involvement:No  Patient / guardian was educated about steps to take if suicide or homicide risk level increases between visits: yes While future psychiatric events cannot be accurately predicted, the patient does not currently require acute inpatient psychiatric care and does not currently meet [place]  involuntary commitment criteria.     Medications: Current Outpatient Medications  Medication Sig Dispense Refill   lisdexamfetamine (VYVANSE ) 40 MG capsule Take 1 capsule (40 mg total) by mouth every morning. 30 capsule 0   lisdexamfetamine (VYVANSE ) 40 MG capsule Take 1 capsule (40 mg total) by mouth every morning. 30 capsule 0   lisdexamfetamine (VYVANSE ) 40 MG capsule Take 1 capsule (40 mg total) by mouth every morning. 30 capsule 0   loratadine (CLARITIN) 10 MG tablet Take 10 mg by mouth daily.     Melatonin 1 MG TABS Take by mouth at bedtime. Takes 2 - 3 tablets     VITAMIN D  PO Take by mouth.     No current facility-administered medications for this visit.   Subjective: Patient engaged in telehealth session via video.  Assessed progress.  He stated that he feels he copes with some levels of seasonal affective disorder, share how he has less motivation, has been less engaged with his girlfriend.  He stated they have been able to talk and how they both cope with seasonal affective, he stated his girlfriend also copes with depression,  anxiety and ADHD.  He stated he has felt stagnant therefore he decided to make some changes.  He stated that he is trying to reset dopamine.  He stated that he is limiting time on his cell phone particularly in the mornings.  He stated that he is try to get a bed consistently at the same time and in the mornings, making time to eat a breakfast and have more time prior to going to work.  He stated he has done this for 2 days and has felt it beneficial. He continues to use  cannabis but expresses his motivation to discontinue use however has struggled recently.  Through further guided discovery, he identified the need to start in the next week or 2 and allow for himself to recognize the positives changes he is making and other aspects of his life and how to try to change too much too quickly.  He stated that he has found it beneficial to start journaling again as well.  We provide support and encouragement for him to continue.    interventions: Supportive therapy, motivational interviewing, CBT  Diagnoses:    ICD-10-CM   1. Attention deficit hyperactivity disorder (ADHD), combined type  F90.2         Plan: Patient is to use coping skills to help manage decrease symptoms.  Patient to utilize his support system, follow through with ways to care for himself, working out, spending time with friends and his girlfriend.    Long-term goal:   Reduce anxiety and improve coping for at least 3 consecutive months oer patient report.    Short-term goal:  Identify stressors that contribute to anxiety and work to resolve the as needed. Decrease anxiety producing self talk and become more mindful of thoughts that increase his anxiety and work to reframe as needed. Identify ways to cope and care for himself to relieve stress and anxiety.   Assessment of progress:  progressing   Lonni Fischer, [facility]

## 2024-10-24 ENCOUNTER — Other Ambulatory Visit: Payer: Self-pay | Admitting: Family Medicine

## 2024-10-24 ENCOUNTER — Other Ambulatory Visit: Payer: Self-pay

## 2024-10-25 ENCOUNTER — Telehealth: Payer: Self-pay | Admitting: Family Medicine

## 2024-10-25 ENCOUNTER — Other Ambulatory Visit: Payer: Self-pay | Admitting: Family Medicine

## 2024-10-25 ENCOUNTER — Ambulatory Visit: Admitting: Mental Health

## 2024-10-25 ENCOUNTER — Other Ambulatory Visit: Payer: Self-pay

## 2024-10-25 DIAGNOSIS — F902 Attention-deficit hyperactivity disorder, combined type: Secondary | ICD-10-CM | POA: Diagnosis not present

## 2024-10-25 MED FILL — Lisdexamfetamine Dimesylate Cap 40 MG: ORAL | 30 days supply | Qty: 30 | Fill #0 | Status: AC

## 2024-10-25 NOTE — Telephone Encounter (Signed)
 Patient is due for a follow-up visit on ADD medicine he needs to schedule This can be virtual or in person Please send him a MyChart message to schedule

## 2024-10-25 NOTE — Progress Notes (Signed)
 [facility] psychotherapy note  Name: Matthew Ibarra Date:  10/25/2024 MRN: [id number] DOB: January 09, 2001 PCP: Alphonsa Glendia LABOR, MD  Time spent: 45 minutes  Treatment:   ind. Therapy  Virtual Visit via Telehealth Note Connected with patient by a telemedicine/telehealth application, with their informed consent, and verified patient privacy and that I am speaking with the correct person using two identifiers. I discussed the limitations, risks, security and privacy concerns of performing psychotherapy and the availability of in person appointments. I also discussed with the patient that there may be a patient responsible charge related to this service. The patient expressed understanding and agreed to proceed. I discussed the treatment planning with the patient. The patient was provided an opportunity to ask questions and all were answered. The patient agreed with the plan and demonstrated an understanding of the instructions. The patient was advised to call  our office if  symptoms worsen or feel they are in a crisis state and need immediate contact.   Therapist Location: office Patient Location: home    Mental Status Exam:    Appearance:    Casual     Behavior:   Appropriate  Motor:   WNL  Speech/Language:    Clear and Coherent  Affect:   Full range   Mood:   Euthymic  Thought process:   Logical, linear, goal directed  Thought content:     WNL  Sensory/Perceptual disturbances:     none  Orientation:   x4  Attention:   Good  Concentration:   Good  Memory:   Intact  Fund of knowledge:    Consistent with age and development  Insight:     Good  Judgment:    Good  Impulse Control:   Good     Reported Symptoms:  anxiety, depressed mood, over thinking at times, some sleep disturbance at times, challenges with focus and attention, distractible(currently managed with Vyvanse )   Risk Assessment: Danger to Self:  No Self-injurious Behavior: No Danger to Others: No Duty to  Warn:no Physical Aggression / Violence:No  Access to Firearms a concern: No  Gang Involvement:No  Patient / guardian was educated about steps to take if suicide or homicide risk level increases between visits: yes While future psychiatric events cannot be accurately predicted, the patient does not currently require acute inpatient psychiatric care and does not currently meet [place]  involuntary commitment criteria.     Medications: Current Outpatient Medications  Medication Sig Dispense Refill   lisdexamfetamine (VYVANSE ) 40 MG capsule Take 1 capsule (40 mg total) by mouth every morning. 30 capsule 0   lisdexamfetamine (VYVANSE ) 40 MG capsule Take 1 capsule (40 mg total) by mouth every morning. 30 capsule 0   lisdexamfetamine (VYVANSE ) 40 MG capsule Take 1 capsule (40 mg total) by mouth every morning. 30 capsule 0   lisdexamfetamine (VYVANSE ) 40 MG capsule Take 1 capsule (40 mg total) by mouth every morning. 30 capsule 0   loratadine (CLARITIN) 10 MG tablet Take 10 mg by mouth daily.     Melatonin 1 MG TABS Take by mouth at bedtime. Takes 2 - 3 tablets     VITAMIN D  PO Take by mouth.     No current facility-administered medications for this visit.   Subjective: Patient engaged in telehealth session via video.  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  He stated he continues to await hearing back from [facility] [employer], which is his first choice.  Went on to share  how he continues his cannabis use but shared how he continues to plan to discontinue use.  Feels he can do this especially when given a deadline such as prior to starting school.  They shared how he had smoked recently and also was drinking 1 evening where he became more emotional, tearful and upset and how he had thoughts about the passing of his father which was about 3 years ago.  He stated this does not occur when he is sober and was curious about reasons this occurred for him.  This was  explored collaboratively, he shared his history of managing and working through the grief related to the loss of his father, how he was able to feel the loss of the first year, experiencing many emotions sadness, depression and anger.  He stated the second year was somewhat better and now the third year he has less emotional moments.  He shared residual feelings of guilt as he recalls trying to utilize CPR to save his father from the heart attack.  Such thoughts as did not do the chest compressions deep enough.  He stated his mother has reassured him while she is also a physician for herself.  Patient was encouraged to consider what his father would tell him.     interventions: Supportive therapy, motivational interviewing, CBT  Diagnoses:    ICD-10-CM   1. Attention deficit hyperactivity disorder (ADHD), combined type  F90.2          Plan: Patient is to use coping skills to help manage decrease symptoms.  Patient to utilize his support system, follow through with ways to care for himself, working out, spending time with friends and his girlfriend.    Long-term goal:   Reduce anxiety and improve coping for at least 3 consecutive months oer patient report.    Short-term goal:  Identify stressors that contribute to anxiety and work to resolve the as needed. Decrease anxiety producing self talk and become more mindful of thoughts that increase his anxiety and work to reframe as needed. Identify ways to cope and care for himself to relieve stress and anxiety.   Assessment of progress:  progressing   Lonni Fischer, [facility]

## 2024-10-26 ENCOUNTER — Encounter: Payer: Self-pay | Admitting: *Deleted

## 2024-10-31 ENCOUNTER — Telehealth: Admitting: Family Medicine

## 2024-10-31 ENCOUNTER — Encounter: Payer: Self-pay | Admitting: Cardiology

## 2024-10-31 ENCOUNTER — Other Ambulatory Visit: Payer: Self-pay

## 2024-10-31 ENCOUNTER — Encounter: Payer: Self-pay | Admitting: Family Medicine

## 2024-10-31 ENCOUNTER — Ambulatory Visit: Attending: Cardiology | Admitting: Cardiology

## 2024-10-31 VITALS — BP 130/84 | HR 74 | Ht 71.0 in | Wt 163.3 lb

## 2024-10-31 DIAGNOSIS — F988 Other specified behavioral and emotional disorders with onset usually occurring in childhood and adolescence: Secondary | ICD-10-CM

## 2024-10-31 DIAGNOSIS — Z8489 Family history of other specified conditions: Secondary | ICD-10-CM

## 2024-10-31 DIAGNOSIS — Z1589 Genetic susceptibility to other disease: Secondary | ICD-10-CM

## 2024-10-31 MED ORDER — LISDEXAMFETAMINE DIMESYLATE 40 MG PO CAPS
40.0000 mg | ORAL_CAPSULE | ORAL | 0 refills | Status: AC
  Filled 2024-10-31: qty 30, 30d supply, fill #0

## 2024-10-31 MED ORDER — LISDEXAMFETAMINE DIMESYLATE 40 MG PO CAPS
40.0000 mg | ORAL_CAPSULE | ORAL | 0 refills | Status: AC
  Filled 2024-10-31 – 2024-12-18 (×2): qty 30, 30d supply, fill #0

## 2024-10-31 MED ORDER — LISDEXAMFETAMINE DIMESYLATE 40 MG PO CAPS
40.0000 mg | ORAL_CAPSULE | Freq: Every morning | ORAL | 0 refills | Status: AC
  Filled 2024-10-31: qty 30, 30d supply, fill #0

## 2024-10-31 NOTE — Progress Notes (Signed)
°  Electrophysiology Office Note:   Date:  10/31/2024  ID:  DENVIL CANNING, DOB 2001-10-26, MRN [id number]  Primary Cardiologist: None Primary Heart Failure: None Electrophysiologist: None      History of Present Illness:   Matthew Ibarra is a 23 y.o. male with h/o ADHD, family history of sudden cardiac death seen today for  for Electrophysiology evaluation of family history of sudden cardiac death, SCN 5A mutation at the request of [name].    His father passed away suddenly at the age of 68 in 2023.  It was reportedly a shockable rhythm during CPR.  Autopsy showed a 1.5 cm wall thickness with dilated LV, scarring and minimal atherosclerosis.  Findings were not completely consistent with hypertrophic cardiomyopathy, potentially sarcoidosis, endocardial fibroelastosis, or vagaries disease.  Wall thickness was not much more than from hypertension alone.  He underwent genetic testing which showed a variant of unknown significance in the SCN5A gene in the TTN gene.  He has been counseled regarding both of these results.  His father had 2 uncles that died suddenly.  This occurred in their 70s in the 49s.  1 uncle died on the cath Table, and another uncle died a few days later.  The patient feels well.  He has no acute complaints.  He is able to do his daily activities.  He has no palpitations.    Review of systems complete and found to be negative unless listed in HPI.   EP Information / Studies Reviewed:    EKG is ordered today. Personal review as below.        Risk Assessment/Calculations:            Physical Exam:   VS:  There were no vitals taken for this visit.   Wt Readings from Last 3 Encounters:  12/19/23 162 lb (73.5 kg)  09/06/23 164 lb 6.4 oz (74.6 kg)  05/03/23 159 lb (72.1 kg)     GEN: Well nourished, well developed in no acute distress NECK: No JVD; No carotid bruits CARDIAC: Regular rate and rhythm, no murmurs, rubs, gallops RESPIRATORY:  Clear to  auscultation without rales, wheezing or rhonchi  ABDOMEN: Soft, non-tender, non-distended EXTREMITIES:  No edema; No deformity   ASSESSMENT AND PLAN:    1.  SCN 5A gene mutation: Patient has a family history of sudden death in his father.  Also 2 paternal uncles have had sudden death.  He has an SCN 5A mutation of unknown significance, though SCN 5B mutations can be seen in both Brugada and long QT syndrome.  He also has a TTN gene mutation which is seen in cardiomyopathies, and muscular dystrophy.  In adjusting his V1 and V2 leads to higher intercostal spaces, he has more of a right bundle branch pattern.  I have offered the patient flecainide challenge to see if he has any signs of Brugada.  At this point, he seems to not want to proceed with this as he is not having any symptoms.  He, in addition has a cardiac MRI without major abnormality and no scar.  Katrena Stehlin discuss this case further with the EP team.   Follow up with EP Team post discussion   Signed, Morrell Fluke Gladis Norton, MD

## 2024-10-31 NOTE — Progress Notes (Signed)
° °  Subjective:    Patient ID: Matthew Ibarra, male    DOB: 2001-02-28, 23 y.o.   MRN: [id number]  HPI Virtual Visit via Video Note  I connected with Matthew Ibarra on 10/31/2024 at  3:30 PM EST by a video enabled telemedicine application and verified that I am speaking with the correct person using two identifiers.  Location: Patient: His workplace Provider: Our office   I discussed the limitations of evaluation and management by telemedicine and the availability of in person appointments. The patient expressed understanding and agreed to proceed.  History of Present Illness:    Observations/Objective:   Assessment and Plan:   Follow Up Instructions:    I discussed the assessment and treatment plan with the patient. The patient was provided an opportunity to ask questions and all were answered. The patient agreed with the plan and demonstrated an understanding of the instructions.   The patient was advised to call back or seek an in-person evaluation if the symptoms worsen or if the condition fails to improve as anticipated.  I provided 15 minutes of non-face-to-face time during this encounter.   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  Patient relates he has been worked up by cardiology for the possibility of Brugada syndrome-he states cardiologist told him would be fine to continue his current meds Review of Systems     Objective:   Physical Exam Patient had virtual visit-video Appears to be in no distress Atraumatic Neuro able to relate and oriented No apparent resp distress Color normal        Assessment & Plan:  ADD Scripts were sent into his pharmacy Will send in an additional when he needs them Patient to follow-up within 4 months to 5 months Patient to keep us  posted if any  setbacks or problems  Follow through cardiology Patient states more than likely he will do flecainide test coming up

## 2024-11-16 ENCOUNTER — Encounter: Payer: Self-pay | Admitting: Cardiology

## 2024-11-20 ENCOUNTER — Telehealth (HOSPITAL_BASED_OUTPATIENT_CLINIC_OR_DEPARTMENT_OTHER): Payer: Self-pay | Admitting: *Deleted

## 2024-11-20 NOTE — Telephone Encounter (Signed)
"  ° °  Pre-operative Risk Assessment    Patient Name: Matthew Ibarra  DOB: 30-Jan-2001 MRN: [id number]   Date of last office visit: 10/31/24 DR. CAMNITZ Date of next office visit: NONE  Request for Surgical Clearance    Procedure:  Dental Extraction - Amount of Teeth to be Pulled:  SURGICAL EXTRACTION OF 3 WISDOM TEETH   Date of Surgery:  Clearance 11/23/24                                Surgeon: NOT LISTED Surgeon's Group or Practice Name:  [facility] Phone number:  [phone] Fax number:  [phone]   Type of Clearance Requested:   - Medical ; NOTES STATING PT HAS NEW DX OF ASYMPTOMATIC AV BLOCK AND RBBB   Type of Anesthesia:  Local  AND IV SEDATION    Additional requests/questions:    Matthew Ibarra   11/20/2024, 12:01 PM   "

## 2024-11-21 NOTE — Telephone Encounter (Signed)
" ° °  Patient Name: Matthew Ibarra  DOB: 02-01-2001 MRN: [id number]  Primary Cardiologist: None  Chart reviewed as part of pre-operative protocol coverage. Pre-op clearance already addressed by colleagues in earlier phone notes. To summarize recommendations:  - Per Dr. Inocencio, patient is low risk. No further cardiac testing needed.   Will route this bundled recommendation to requesting provider via Epic fax function and remove from pre-op pool. Please call with questions.  [name], PA-C 11/21/2024, 7:53 AM  "

## 2024-11-23 ENCOUNTER — Other Ambulatory Visit: Payer: Self-pay

## 2024-11-23 MED ORDER — IBUPROFEN 600 MG PO TABS
600.0000 mg | ORAL_TABLET | Freq: Three times a day (TID) | ORAL | 0 refills | Status: AC
  Filled 2024-11-23: qty 20, 7d supply, fill #0

## 2024-11-23 MED ORDER — AMOXICILLIN 250 MG PO CAPS
250.0000 mg | ORAL_CAPSULE | Freq: Three times a day (TID) | ORAL | 0 refills | Status: AC
  Filled 2024-11-23: qty 15, 5d supply, fill #0

## 2024-11-24 ENCOUNTER — Other Ambulatory Visit: Payer: Self-pay

## 2024-11-29 ENCOUNTER — Other Ambulatory Visit: Payer: Self-pay

## 2024-11-29 MED ORDER — CHLORHEXIDINE GLUCONATE 0.12 % MT SOLN
10.0000 mL | Freq: Two times a day (BID) | OROMUCOSAL | 0 refills | Status: AC
  Filled 2024-11-29: qty 473, 24d supply, fill #0

## 2024-11-30 ENCOUNTER — Ambulatory Visit: Admitting: Mental Health

## 2024-12-12 ENCOUNTER — Ambulatory Visit: Admitting: Mental Health

## 2024-12-12 DIAGNOSIS — F902 Attention-deficit hyperactivity disorder, combined type: Secondary | ICD-10-CM | POA: Diagnosis not present

## 2024-12-12 NOTE — Progress Notes (Signed)
 [facility] psychotherapy note  Name: Matthew Ibarra Date:  12/12/24 MRN: [id number] DOB: 14-Feb-2001 PCP: Alphonsa Glendia LABOR, MD  Time spent: 46 minutes  Treatment:   ind. Therapy  Virtual Visit via Telehealth Note Connected with patient by a telemedicine/telehealth application, with their informed consent, and verified patient privacy and that I am speaking with the correct person using two identifiers. I discussed the limitations, risks, security and privacy concerns of performing psychotherapy and the availability of in person appointments. I also discussed with the patient that there may be a patient responsible charge related to this service. The patient expressed understanding and agreed to proceed. I discussed the treatment planning with the patient. The patient was provided an opportunity to ask questions and all were answered. The patient agreed with the plan and demonstrated an understanding of the instructions. The patient was advised to call  our office if  symptoms worsen or feel they are in a crisis state and need immediate contact.   Therapist Location: office Patient Location: home    Mental Status Exam:    Appearance:    Casual     Behavior:   Appropriate  Motor:   WNL  Speech/Language:    Clear and Coherent  Affect:   Full range   Mood:   Euthymic  Thought process:   Logical, linear, goal directed  Thought content:     WNL  Sensory/Perceptual disturbances:     none  Orientation:   x4  Attention:   Good  Concentration:   Good  Memory:   Intact  Fund of knowledge:    Consistent with age and development  Insight:     Good  Judgment:    Good  Impulse Control:   Good     Reported Symptoms:  anxiety, depressed mood, over thinking at times, some sleep disturbance at times, challenges with focus and attention, distractible(currently managed with Vyvanse )   Risk Assessment: Danger to Self:  No Self-injurious Behavior: No Danger to Others: No Duty to  Warn:no Physical Aggression / Violence:No  Access to Firearms a concern: No  Gang Involvement:No  Patient / guardian was educated about steps to take if suicide or homicide risk level increases between visits: yes While future psychiatric events cannot be accurately predicted, the patient does not currently require acute inpatient psychiatric care and does not currently meet [facility]  involuntary commitment criteria.     Medications: Current Outpatient Medications  Medication Sig Dispense Refill   amoxicillin  (AMOXIL ) 250 MG capsule Take 1 capsule three times per day until finished 15 capsule 0   chlorhexidine  (PERIDEX ) 0.12 % solution Rinse mouth with 10ml for 1 minute and then spit out; twice per day 473 mL 0   ibuprofen  (ADVIL ) 600 MG tablet Take 1 tablet (600 mg total) by mouth 3 (three) times daily until finished 20 tablet 0   lisdexamfetamine  (VYVANSE ) 40 MG capsule Take 1 capsule (40 mg total) by mouth every morning. 30 capsule 0   lisdexamfetamine  (VYVANSE ) 40 MG capsule Take 1 capsule (40 mg total) by mouth every morning. 30 capsule 0   lisdexamfetamine  (VYVANSE ) 40 MG capsule Take 1 capsule (40 mg total) by mouth every morning. 30 capsule 0   loratadine (CLARITIN) 10 MG tablet Take 10 mg by mouth daily.     Melatonin 1 MG TABS Take by mouth at bedtime. Takes 2 - 3 tablets     VITAMIN D  PO Take by mouth.     No current facility-administered medications for  this visit.   Subjective: Patient engaged in telehealth session via video.  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 He stated that he stopped using THC for the past month but has used over the past few days a couple of times.  He stated that he has noticed a difference, less of a tendency to want to use it most nights.  Explored his motivation for use where he stated that he uses sometimes because I dont deserve to be sober.  Through further  guided discovery, he identified how this relates to the loss of his father, residual feelings of guilt.  He reviewed how he has had discussions with his mother, who is also a physician, who reassured him that at the time of his father's passing he did everything he could to revive him.  Patient also identified the pain of the loss that continues, how his relationship with this feelings, the loss feels like a connection to his father which has been hard to let go of; they were very close and a meaningful impact on his life.  Patient was encouraged to remind himself of what he knows to be true versus thoughts that relate to the intensity of the emotions that remain.  Provide support and understanding throughout.    interventions: Supportive therapy, motivational interviewing, CBT  Diagnoses:    ICD-10-CM   1. Attention deficit hyperactivity disorder (ADHD), combined type  F90.2       Plan: Patient is to use coping skills to help manage decrease symptoms.  Patient to utilize his support system, follow through with ways to care for himself, working out, spending time with friends and his girlfriend.    Long-term goal:   Reduce anxiety and improve coping for at least 3 consecutive months oer patient report.    Short-term goal:  Identify stressors that contribute to anxiety and work to resolve the as needed. Decrease anxiety producing self talk and become more mindful of thoughts that increase his anxiety and work to reframe as needed. Identify ways to cope and care for himself to relieve stress and anxiety.   Assessment of progress:  progressing   Lonni Fischer, [facility]

## 2024-12-18 ENCOUNTER — Other Ambulatory Visit: Payer: Self-pay

## 2024-12-18 ENCOUNTER — Encounter: Payer: Self-pay | Admitting: Adult Health

## 2024-12-18 ENCOUNTER — Telehealth: Admitting: Adult Health

## 2024-12-18 DIAGNOSIS — F902 Attention-deficit hyperactivity disorder, combined type: Secondary | ICD-10-CM

## 2024-12-18 DIAGNOSIS — F4321 Adjustment disorder with depressed mood: Secondary | ICD-10-CM

## 2024-12-18 MED ORDER — ESCITALOPRAM OXALATE 10 MG PO TABS
10.0000 mg | ORAL_TABLET | Freq: Every day | ORAL | 2 refills | Status: AC
  Filled 2024-12-18: qty 30, 30d supply, fill #0

## 2024-12-24 ENCOUNTER — Ambulatory Visit: Admitting: Mental Health
